# Patient Record
Sex: Male | Born: 1995 | Race: Black or African American | Hispanic: No | Marital: Married | Smoking: Former smoker
Health system: Southern US, Community
[De-identification: ages and names within clinical notes are randomized; demographics above are authoritative.]

---
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
## 2017-01-09 ENCOUNTER — Encounter: Payer: Self-pay | Admitting: Urgent Care

## 2017-01-09 ENCOUNTER — Ambulatory Visit (INDEPENDENT_AMBULATORY_CARE_PROVIDER_SITE_OTHER): Payer: 59 | Admitting: Urgent Care

## 2017-01-09 VITALS — BP 118/58 | HR 67 | Temp 99.3°F | Resp 18 | Ht 70.63 in | Wt 200.0 lb

## 2017-01-09 DIAGNOSIS — Z113 Encounter for screening for infections with a predominantly sexual mode of transmission: Secondary | ICD-10-CM | POA: Diagnosis not present

## 2017-01-09 DIAGNOSIS — Z202 Contact with and (suspected) exposure to infections with a predominantly sexual mode of transmission: Secondary | ICD-10-CM

## 2017-01-09 MED ORDER — AZITHROMYCIN 500 MG PO TABS: 1000.0000 mg | ORAL_TABLET | Freq: Once | ORAL | 0 refills | Status: AC

## 2017-01-09 MED ORDER — CEFTRIAXONE SODIUM 250 MG IJ SOLR
250.0000 mg | Freq: Once | INTRAMUSCULAR | Status: AC
  Administered 2017-01-09: 250 mg via INTRAMUSCULAR

## 2017-01-09 NOTE — Progress Notes (Signed)
    MRN: [id number] DOB: [birth year]  Subjective:   Timothy Andrews is a 21 y.o. male presenting for chief complaint of Exposure to STD (X 1 mth- chlamydia)  Reports having had sex with a male partner that tested positive for chlamydia. Denies dysuria, hematuria, urinary frequency, penile discharge, penile swelling, testicular pain, testicular swelling, anal pain, groin pain. Does not always use condoms for protection.  Timothy Andrews is not currently taking any medications and also has No Known Allergies.  Timothy Andrews denies past medical and surgical history.   Objective:   Vitals: BP (!) 118/58 (BP Location: Right Arm, Patient Position: Sitting, Cuff Size: Large)   Pulse 67   Temp 99.3 F (37.4 C) (Oral)   Resp 18   Ht 5' 10.63" (1.794 m)   Wt 200 lb (90.7 kg)   SpO2 99%   BMI 28.19 kg/m   Physical Exam  Constitutional: He is oriented to person, place, and time. He appears well-developed and well-nourished.  Cardiovascular: Normal rate.   Pulmonary/Chest: Effort normal.  Neurological: He is alert and oriented to person, place, and time.   Assessment and Plan :   1. Exposure to chlamydia 2. Routine screening for STI (sexually transmitted infection) - Will treat for chlamydia and gonorrhea. Labs pending. Counseled patient on safe sex. - HIV antibody - RPR - GC/Chlamydia Probe Amp - Trichomonas vaginalis, RNA   Wallis Bamberg, PA-C [facility] at [facility] [phone] 01/09/2017  5:01 PM

## 2017-01-09 NOTE — Patient Instructions (Addendum)
Safe Sex Practicing safe sex means taking steps before and during sex to reduce your risk of:  Getting an STD (sexually transmitted disease).  Giving your partner an STD.  Unwanted pregnancy.  How can I practice safe sex?  To practice safe sex:  Limit your sexual partners to only one partner who is having sex with only you.  Avoid using alcohol and recreational drugs before having sex. These substances can affect your judgment.  Before having sex with a new partner: ? Talk to your partner about past partners, past STDs, and drug use. ? You and your partner should be screened for STDs and discuss the results with each other.  Check your body regularly for sores, blisters, rashes, or unusual discharge. If you notice any of these problems, visit your health care provider.  If you have symptoms of an infection or you are being treated for an STD, avoid sexual contact.  While having sex, use a condom. Make sure to: ? Use a condom every time you have vaginal, oral, or anal sex. Both females and males should wear condoms during oral sex. ? Keep condoms in place from the beginning to the end of sexual activity. ? Use a latex condom, if possible. Latex condoms offer the best protection. ? Use only water-based lubricants or oils to lubricate a condom. Using petroleum-based lubricants or oils will weaken the condom and increase the chance that it will break.  See your health care provider for regular screenings, exams, and tests for STDs.  Talk with your health care provider about the form of birth control (contraception) that is best for you.  Get vaccinated against hepatitis B and human papillomavirus (HPV).  If you are at risk of being infected with HIV (human immunodeficiency virus), talk with your health care provider about taking a prescription medicine to prevent HIV infection. You are considered at risk for HIV if: ? You are a man who has sex with other men. ? You are a  heterosexual man or woman who is sexually active with more than one partner. ? You take drugs by injection. ? You are sexually active with a partner who has HIV.  This information is not intended to replace advice given to you by your health care provider. Make sure you discuss any questions you have with your health care provider. Document Released: 05/09/2004 Document Revised: 08/16/2015 Document Reviewed: 02/19/2015 Elsevier Interactive Patient Education  2018 [employer]     IF you received an x-ray today, you will receive an invoice from [facility]. Please contact [facility] at [phone] with questions or concerns regarding your invoice.   IF you received labwork today, you will receive an invoice from [facility]. Please contact [facility] at [phone] with questions or concerns regarding your invoice.   Our billing staff will not be able to assist you with questions regarding bills from these companies.  You will be contacted with the lab results as soon as they are available. The fastest way to get your results is to activate your My Chart account. Instructions are located on the last page of this paperwork. If you have not heard from us regarding the results in 2 weeks, please contact this office.      

## 2017-01-10 LAB — RPR: RPR: NONREACTIVE

## 2017-01-10 LAB — GC/CHLAMYDIA PROBE AMP
CHLAMYDIA, DNA PROBE: POSITIVE — AB
NEISSERIA GONORRHOEAE BY PCR: NEGATIVE

## 2017-01-10 LAB — HIV ANTIBODY (ROUTINE TESTING W REFLEX): HIV Screen 4th Generation wRfx: NONREACTIVE

## 2017-01-11 LAB — TRICHOMONAS VAGINALIS, PROBE AMP: TRICH VAG BY NAA: NEGATIVE

## 2017-01-13 ENCOUNTER — Telehealth: Payer: Self-pay | Admitting: Family Medicine

## 2017-01-14 NOTE — Telephone Encounter (Signed)
Patient is coming in on Friday.  

## 2017-01-17 ENCOUNTER — Ambulatory Visit (INDEPENDENT_AMBULATORY_CARE_PROVIDER_SITE_OTHER): Payer: 59 | Admitting: Urgent Care

## 2017-01-17 ENCOUNTER — Encounter: Payer: Self-pay | Admitting: Urgent Care

## 2017-01-17 VITALS — BP 140/80 | HR 54 | Temp 98.5°F | Resp 18 | Ht 70.0 in | Wt 206.5 lb

## 2017-01-17 DIAGNOSIS — Z113 Encounter for screening for infections with a predominantly sexual mode of transmission: Secondary | ICD-10-CM | POA: Diagnosis not present

## 2017-01-17 DIAGNOSIS — Z202 Contact with and (suspected) exposure to infections with a predominantly sexual mode of transmission: Secondary | ICD-10-CM | POA: Diagnosis not present

## 2017-01-17 MED ORDER — AZITHROMYCIN 500 MG PO TABS: 1000.0000 mg | ORAL_TABLET | Freq: Once | ORAL | 0 refills | Status: AC
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
## 2017-01-17 NOTE — Patient Instructions (Addendum)
Chlamydia, Male Chlamydia is an STD (sexually transmitted disease). It is a bacterial infection that spreads through sexual contact (is contagious). Chlamydia can occur in different areas of the body, including the tube that moves urine from the bladder out of the body (urethra), the throat, or the rectum. This condition is not difficult to treat. However, if left untreated, chlamydia can lead to more serious health problems. What are the causes? Chlamydia is caused by the bacteria Chlamydia trachomatis. It is passed from an infected partner during sexual activity. Chlamydia can spread through contact with the genitals, mouth, or rectum. What are the signs or symptoms? In some cases, there may not be any symptoms for this condition (asymptomatic), especially early in the infection. If symptoms develop, they may include:  Burning when urinating.  Urinating frequently.  Pain or swelling in the testicles.  Watery, mucus-like discharge from the penis.  Redness, soreness, and swelling (inflammation) of the rectum.  Bleeding or discharge from the rectum.  Abdominal pain.  Itching, burning, or redness in the eyes, or discharge from the eyes.  How is this diagnosed? This condition may be diagnosed based on:  Urine tests.  Swab tests. Depending on your symptoms, your health care provider may use a cotton swab to collect discharge from your urethra or rectum to test for the bacteria.  How is this treated? This condition is treated with antibiotic medicines. Follow these instructions at home: Medicines  Take over-the-counter and prescription medicines only as told by your health care provider.  Take your antibiotic medicine as told by your health care provider. Do not stop taking the antibiotic even if you start to feel better. Sexual activity  Tell sexual partners about your infection. This includes any oral, anal, or vaginal sex partners you have had within 60 days of when your  symptoms started. Sexual partners should also be treated, even if they have no signs of the disease.  Do not have sex until you and your sexual partners have completed treatment and your health care provider says it is okay. If your health care provider prescribed you a single dose treatment, wait 7 days after taking the treatment before having sex. General instructions  It is your responsibility to get your test results. Ask your health care provider, or the department performing the test, when your results will be ready.  Get plenty of rest.  Eat a healthy, well-balanced diet.  Drink enough fluids to keep your urine clear or pale yellow.  Keep all follow-up visits as told by your health care provider. This is important. You may need to be tested for infection again 3 months after treatment. How is this prevented? The only sure way to prevent chlamydia is to avoid sexual intercourse. However, you can lower your risk by:  Using latex condoms correctly every time you have sexual intercourse.  Not having multiple sexual partners.  Asking if your sexual partner has been tested for STIs and had negative results.  Contact a health care provider if:  You develop new symptoms or your symptoms do not get better after completing treatment.  You have a fever or chills.  You have pain during sexual intercourse.  You develop new joint pain or swelling near your joints.  You have pain or soreness in your testicles. Get help right away if:  Your pain gets worse and does not get better with medicine.  You have abnormal discharge.  You develop flu-like symptoms, such as night sweats, sore throat, or muscle   aches. Summary  Chlamydia is an STD (sexually transmitted disease). It is a bacterial infection that spreads (is contagious) through sexual contact.  This condition is not difficult to treat, however, if left untreated, it can lead to more serious health problems.  In some cases,  there may not be any symptoms for this condition (asymptomatic).  This condition is treated with antibiotic medicines.  Using latex condoms correctly every time you have sexual intercourse can help prevent chlamydia. This information is not intended to replace advice given to you by your health care provider. Make sure you discuss any questions you have with your health care provider. Document Released: 04/01/2005 Document Revised: 03/18/2016 Document Reviewed: 03/18/2016 Elsevier Interactive Patient Education  2017 [employer].     IF you received an x-ray today, you will receive an invoice from [facility]. Please contact [facility] at [phone] with questions or concerns regarding your invoice.   IF you received labwork today, you will receive an invoice from [place]. Please contact [facility] at [phone] with questions or concerns regarding your invoice.   Our billing staff will not be able to assist you with questions regarding bills from these companies.  You will be contacted with the lab results as soon as they are available. The fastest way to get your results is to activate your My Chart account. Instructions are located on the last page of this paperwork. If you have not heard from [place] regarding the results in 2 weeks, please contact this office.

## 2017-06-18 DIAGNOSIS — S83522A Sprain of posterior cruciate ligament of left knee, initial encounter: Secondary | ICD-10-CM | POA: Insufficient documentation

## 2017-07-13 ENCOUNTER — Encounter (HOSPITAL_COMMUNITY): Payer: Self-pay | Admitting: Emergency Medicine

## 2017-07-13 ENCOUNTER — Emergency Department (HOSPITAL_COMMUNITY)
Admission: EM | Admit: 2017-07-13 | Discharge: 2017-07-14 | Disposition: A | Discharge status: Home / Self Care | Payer: 59 | Attending: Emergency Medicine | Admitting: Emergency Medicine

## 2017-07-13 ENCOUNTER — Emergency Department (HOSPITAL_COMMUNITY): Payer: 59

## 2017-07-13 DIAGNOSIS — T5891XA Toxic effect of carbon monoxide from unspecified source, accidental (unintentional), initial encounter: Secondary | ICD-10-CM | POA: Insufficient documentation

## 2017-07-13 DIAGNOSIS — T59811A Toxic effect of smoke, accidental (unintentional), initial encounter: Secondary | ICD-10-CM

## 2017-07-13 DIAGNOSIS — J705 Respiratory conditions due to smoke inhalation: Secondary | ICD-10-CM | POA: Insufficient documentation

## 2017-07-13 DIAGNOSIS — J9601 Acute respiratory failure with hypoxia: Secondary | ICD-10-CM | POA: Diagnosis not present

## 2017-07-13 LAB — I-STAT VENOUS BLOOD GAS, ED
Acid-base deficit: 9 mmol/L — ABNORMAL HIGH (ref 0.0–2.0)
BICARBONATE: 19.4 mmol/L — AB (ref 20.0–28.0)
O2 Saturation: 27 %
PCO2 VEN: 50.3 mmHg (ref 44.0–60.0)
PO2 VEN: 22 mmHg — AB (ref 32.0–45.0)
TCO2: 21 mmol/L — AB (ref 22–32)
pH, Ven: 7.195 — CL (ref 7.250–7.430)

## 2017-07-13 LAB — BPAM FFP
BLOOD PRODUCT EXPIRATION DATE: 201904142359
Blood Product Expiration Date: 201904232359
ISSUE DATE / TIME: 201903312043
ISSUE DATE / TIME: 201903312043
UNIT TYPE AND RH: 6200
Unit Type and Rh: 6200

## 2017-07-13 LAB — CBC
HCT: 45.6 % (ref 39.0–52.0)
Hemoglobin: 15.4 g/dL (ref 13.0–17.0)
MCH: 29.7 pg (ref 26.0–34.0)
MCHC: 33.8 g/dL (ref 30.0–36.0)
MCV: 87.9 fL (ref 78.0–100.0)
PLATELETS: DECREASED 10*3/uL (ref 150–400)
RBC: 5.19 MIL/uL (ref 4.22–5.81)
RDW: 14.8 % (ref 11.5–15.5)
WBC: 9.8 10*3/uL (ref 4.0–10.5)

## 2017-07-13 LAB — COMPREHENSIVE METABOLIC PANEL
ALT: 14 U/L — AB (ref 17–63)
AST: 28 U/L (ref 15–41)
Albumin: 4.3 g/dL (ref 3.5–5.0)
Alkaline Phosphatase: 59 U/L (ref 38–126)
Anion gap: 16 — ABNORMAL HIGH (ref 5–15)
BUN: 5 mg/dL — ABNORMAL LOW (ref 6–20)
CHLORIDE: 105 mmol/L (ref 101–111)
CO2: 21 mmol/L — AB (ref 22–32)
CREATININE: 1.48 mg/dL — AB (ref 0.61–1.24)
Calcium: 9.1 mg/dL (ref 8.9–10.3)
GFR calc Af Amer: >60 mL/min (ref >60)
GFR calc non Af Amer: >60 mL/min (ref >60)
Glucose, Bld: 197 mg/dL — ABNORMAL HIGH (ref 65–99)
POTASSIUM: 3.7 mmol/L (ref 3.5–5.1)
SODIUM: 142 mmol/L (ref 135–145)
Total Bilirubin: 0.7 mg/dL (ref 0.3–1.2)
Total Protein: 7.2 g/dL (ref 6.5–8.1)

## 2017-07-13 LAB — PREPARE FRESH FROZEN PLASMA
UNIT DIVISION: 0
Unit division: 0

## 2017-07-13 LAB — RAPID URINE DRUG SCREEN, HOSP PERFORMED
AMPHETAMINES: NOT DETECTED
Barbiturates: NOT DETECTED
Benzodiazepines: POSITIVE — AB
Cocaine: NOT DETECTED
Opiates: NOT DETECTED
TETRAHYDROCANNABINOL: POSITIVE — AB

## 2017-07-13 LAB — I-STAT ARTERIAL BLOOD GAS, ED
Acid-base deficit: 5 mmol/L — ABNORMAL HIGH (ref 0.0–2.0)
BICARBONATE: 22.6 mmol/L (ref 20.0–28.0)
O2 Saturation: 99 %
PCO2 ART: 51.9 mmHg — AB (ref 32.0–48.0)
PO2 ART: 177 mmHg — AB (ref 83.0–108.0)
Patient temperature: 98.6
TCO2: 24 mmol/L (ref 22–32)
pH, Arterial: 7.247 — ABNORMAL LOW (ref 7.350–7.450)

## 2017-07-13 LAB — I-STAT CG4 LACTIC ACID, ED: Lactic Acid, Venous: 9.09 mmol/L (ref 0.5–1.9)

## 2017-07-13 LAB — COOXEMETRY PANEL
CARBOXYHEMOGLOBIN: 36.9 % — AB (ref 0.5–1.5)
Methemoglobin: 1.8 % — ABNORMAL HIGH (ref 0.0–1.5)
O2 SAT: 70.9 %
Total hemoglobin: 14.7 g/dL (ref 12.0–16.0)

## 2017-07-13 LAB — CK: Total CK: 192 U/L (ref 49–397)

## 2017-07-13 LAB — CBG MONITORING, ED: Glucose-Capillary: 212 mg/dL — ABNORMAL HIGH (ref 65–99)

## 2017-07-13 MED ORDER — SODIUM CHLORIDE 0.9 % IV BOLUS
2000.0000 mL | Freq: Once | INTRAVENOUS | Status: AC
  Administered 2017-07-13: 1000 mL via INTRAVENOUS

## 2017-07-13 MED ORDER — FENTANYL CITRATE (PF) 100 MCG/2ML IJ SOLN: 100.0000 ug | Freq: Once | INTRAMUSCULAR | Status: DC

## 2017-07-13 MED ORDER — PROPOFOL 1000 MG/100ML IV EMUL
INTRAVENOUS | Status: AC
  Filled 2017-07-13: qty 100

## 2017-07-13 MED ORDER — FENTANYL BOLUS VIA INFUSION
50.0000 ug | INTRAVENOUS | Status: DC | PRN
  Administered 2017-07-13 (×2): 50 ug via INTRAVENOUS
  Filled 2017-07-13: qty 50

## 2017-07-13 MED ORDER — FENTANYL 2500MCG IN NS 250ML (10MCG/ML) PREMIX INFUSION
25.0000 ug/h | INTRAVENOUS | Status: DC
Start: 2017-07-13 — End: 2017-07-14
  Administered 2017-07-13: 75 ug/h via INTRAVENOUS
  Filled 2017-07-13: qty 250

## 2017-07-13 MED ORDER — SODIUM NITRITE-SOD THIOSULFATE 300MG/10ML-12.5 GM/50ML IV KIT
1.0000 | PACK | Freq: Once | INTRAVENOUS | Status: AC
  Administered 2017-07-13: 1 via INTRAVENOUS
  Filled 2017-07-13: qty 1

## 2017-07-13 MED ORDER — ROCURONIUM BROMIDE 50 MG/5ML IV SOLN
100.0000 mg | Freq: Once | INTRAVENOUS | Status: AC
  Administered 2017-07-13: 100 mg via INTRAVENOUS
  Filled 2017-07-13: qty 10

## 2017-07-13 MED ORDER — ETOMIDATE 2 MG/ML IV SOLN
30.0000 mg | Freq: Once | INTRAVENOUS | Status: AC
  Administered 2017-07-13: 130 mg via INTRAVENOUS

## 2017-07-13 MED ORDER — FENTANYL CITRATE (PF) 100 MCG/2ML IJ SOLN
50.0000 ug | Freq: Once | INTRAMUSCULAR | Status: AC
  Administered 2017-07-13: 100 ug via INTRAVENOUS

## 2017-07-13 MED ORDER — LACTATED RINGERS IV SOLN
INTRAVENOUS | Status: DC
  Administered 2017-07-13: 22:00:00 via INTRAVENOUS

## 2017-07-13 MED ORDER — FENTANYL CITRATE (PF) 100 MCG/2ML IJ SOLN
INTRAMUSCULAR | Status: AC
  Administered 2017-07-13: 100 ug via INTRAVENOUS
  Filled 2017-07-13: qty 2
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
## 2017-07-13 NOTE — Progress Notes (Signed)
Critical values called to RT Gwendlyn DeutscherWilliam Brown, RRT on 07/13/2017 @ 21:44 by Donato Schultzracey Sara Selvidge RRT.  COhb 36.9 Methb 1.8
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
## 2017-07-13 NOTE — ED Notes (Signed)
[facility] at bedside 
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
## 2017-07-14 ENCOUNTER — Encounter: Payer: Self-pay | Admitting: Urgent Care

## 2017-07-14 DIAGNOSIS — T1490XA Injury, unspecified, initial encounter: Secondary | ICD-10-CM | POA: Insufficient documentation

## 2017-07-14 LAB — TYPE AND SCREEN
UNIT DIVISION: 0
Unit division: 0

## 2017-07-14 LAB — BPAM RBC
BLOOD PRODUCT EXPIRATION DATE: 201904162359
Blood Product Expiration Date: 201904152359
ISSUE DATE / TIME: 201903312042
ISSUE DATE / TIME: 201903312042
UNIT TYPE AND RH: 9500
Unit Type and Rh: 9500

## 2017-07-17 LAB — BLOOD GAS, ARTERIAL
ACID-BASE DEFICIT: 6.3 mmol/L — AB (ref 0.0–2.0)
BICARBONATE: 20.1 mmol/L (ref 20.0–28.0)
O2 SAT: 70.9 %
PCO2 ART: 51.2 mmHg — AB (ref 32.0–48.0)
PO2 ART: 30.1 mmHg — AB (ref 83.0–108.0)
Patient temperature: 98.6
pH, Arterial: 7.219 — ABNORMAL LOW (ref 7.350–7.450)

## 2017-07-21 DIAGNOSIS — J384 Edema of larynx: Secondary | ICD-10-CM | POA: Insufficient documentation

## 2017-10-15 ENCOUNTER — Other Ambulatory Visit (INDEPENDENT_AMBULATORY_CARE_PROVIDER_SITE_OTHER): Payer: 59

## 2017-10-15 ENCOUNTER — Ambulatory Visit (INDEPENDENT_AMBULATORY_CARE_PROVIDER_SITE_OTHER)
Admission: RE | Admit: 2017-10-15 | Discharge: 2017-10-15 | Disposition: A | Discharge status: Home / Self Care | Payer: 59 | Source: Ambulatory Visit | Attending: Internal Medicine | Admitting: Internal Medicine

## 2017-10-15 ENCOUNTER — Encounter: Payer: Self-pay | Admitting: Internal Medicine

## 2017-10-15 ENCOUNTER — Ambulatory Visit (INDEPENDENT_AMBULATORY_CARE_PROVIDER_SITE_OTHER): Payer: 59 | Admitting: Internal Medicine

## 2017-10-15 VITALS — BP 118/70 | HR 77 | Ht 70.0 in | Wt 192.0 lb

## 2017-10-15 DIAGNOSIS — R918 Other nonspecific abnormal finding of lung field: Secondary | ICD-10-CM

## 2017-10-15 DIAGNOSIS — J4550 Severe persistent asthma, uncomplicated: Secondary | ICD-10-CM

## 2017-10-15 DIAGNOSIS — R0609 Other forms of dyspnea: Secondary | ICD-10-CM | POA: Diagnosis not present

## 2017-10-15 LAB — CBC WITH DIFFERENTIAL/PLATELET
BASOS PCT: 0.3 % (ref 0.0–3.0)
Basophils Absolute: 0 10*3/uL (ref 0.0–0.1)
EOS ABS: 0.1 10*3/uL (ref 0.0–0.7)
EOS PCT: 1.3 % (ref 0.0–5.0)
HEMATOCRIT: 46.3 % (ref 39.0–52.0)
Hemoglobin: 15.2 g/dL (ref 13.0–17.0)
LYMPHS PCT: 24 % (ref 12.0–46.0)
Lymphs Abs: 2.3 10*3/uL (ref 0.7–4.0)
MCHC: 32.7 g/dL (ref 30.0–36.0)
MCV: 83.2 fl (ref 78.0–100.0)
MONO ABS: 0.7 10*3/uL (ref 0.1–1.0)
Monocytes Relative: 7.3 % (ref 3.0–12.0)
NEUTROS ABS: 6.5 10*3/uL (ref 1.4–7.7)
Neutrophils Relative %: 67.1 % (ref 43.0–77.0)
PLATELETS: 200 10*3/uL (ref 150.0–400.0)
RBC: 5.56 Mil/uL (ref 4.22–5.81)
RDW: 16.5 % — AB (ref 11.5–15.5)
WBC: 9.7 10*3/uL (ref 4.0–10.5)

## 2017-10-15 LAB — SEDIMENTATION RATE: Sed Rate: 5 mm/hr (ref 0–15)

## 2017-10-15 MED ORDER — BUDESONIDE-FORMOTEROL FUMARATE 160-4.5 MCG/ACT IN AERO: 2.0000 | INHALATION_SPRAY | Freq: Two times a day (BID) | RESPIRATORY_TRACT | 11 refills | Status: DC

## 2017-10-15 MED ORDER — BUDESONIDE-FORMOTEROL FUMARATE 80-4.5 MCG/ACT IN AERO: 2.0000 | INHALATION_SPRAY | Freq: Two times a day (BID) | RESPIRATORY_TRACT | 0 refills | Status: DC

## 2017-10-15 MED ORDER — AMOXICILLIN-POT CLAVULANATE 875-125 MG PO TABS: 1.0000 | ORAL_TABLET | Freq: Two times a day (BID) | ORAL | 0 refills | Status: AC

## 2017-10-15 NOTE — Patient Instructions (Addendum)
Plan A = Automatic = symbicotrt 160 Take 2 puffs first thing in am and then another 2 puffs about 12 hours later.   Work on inhaler technique:  relax and gently blow all the way out then take a nice smooth deep breath back in, triggering the inhaler at same time you start breathing in.  Hold for up to 5 seconds if you can. Blow out thru nose. Rinse and gargle with water when done  Augmentin 875 mg take one pill twice daily  X 10 days - take at breakfast and supper with large glass of water.  It would help reduce the usual side effects (diarrhea and yeast infections) if you ate cultured yogurt at lunch.   Stop pulmocort (budesonide)  Plan B = Backup Only use your albuterol as a rescue medication to be used if you can't catch your breath by resting   - The less you use it, the better it will work when you need it. - Ok to use  Albuterol up to   every 4 hours if you must but call for appointment if use goes up over your usual need   For cough > mucinex dm up to 1200 mg every 12 hours as needed  Please remember to go to the lab and x-ray department downstairs in the basement  for your tests - we will call you with the results when they are available.     Please schedule a follow up office visit in 2 weeks, sooner if needed
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
## 2017-10-16 ENCOUNTER — Encounter: Payer: Self-pay | Admitting: Internal Medicine

## 2017-10-16 DIAGNOSIS — J4550 Severe persistent asthma, uncomplicated: Secondary | ICD-10-CM | POA: Insufficient documentation
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
## 2017-10-20 NOTE — Progress Notes (Signed)
ATC, NA and no option to leave msg 

## 2017-10-21 NOTE — Progress Notes (Signed)
Spoke with pt and notified of results per Dr. [name]. Pt verbalized understanding and denied any questions. 

## 2017-10-23 DIAGNOSIS — H6123 Impacted cerumen, bilateral: Secondary | ICD-10-CM | POA: Insufficient documentation
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
## 2017-11-06 ENCOUNTER — Ambulatory Visit (INDEPENDENT_AMBULATORY_CARE_PROVIDER_SITE_OTHER): Payer: 59 | Admitting: Pulmonary Disease

## 2017-11-06 ENCOUNTER — Ambulatory Visit
Admission: RE | Admit: 2017-11-06 | Discharge: 2017-11-06 | Disposition: A | Discharge status: Home / Self Care | Payer: Self-pay | Source: Ambulatory Visit | Attending: Pulmonary Disease | Admitting: Pulmonary Disease

## 2017-11-06 ENCOUNTER — Encounter: Payer: Self-pay | Admitting: Pulmonary Disease

## 2017-11-06 ENCOUNTER — Ambulatory Visit (INDEPENDENT_AMBULATORY_CARE_PROVIDER_SITE_OTHER)
Admission: RE | Admit: 2017-11-06 | Discharge: 2017-11-06 | Disposition: A | Discharge status: Home / Self Care | Payer: 59 | Source: Ambulatory Visit | Attending: Pulmonary Disease | Admitting: Pulmonary Disease

## 2017-11-06 VITALS — BP 132/84 | HR 69 | Ht 70.0 in | Wt 195.2 lb

## 2017-11-06 DIAGNOSIS — R918 Other nonspecific abnormal finding of lung field: Secondary | ICD-10-CM

## 2017-11-06 DIAGNOSIS — R0609 Other forms of dyspnea: Secondary | ICD-10-CM

## 2017-11-06 DIAGNOSIS — J4550 Severe persistent asthma, uncomplicated: Secondary | ICD-10-CM | POA: Diagnosis not present

## 2017-11-06 MED ORDER — BUDESONIDE-FORMOTEROL FUMARATE 160-4.5 MCG/ACT IN AERO: 2.0000 | INHALATION_SPRAY | Freq: Two times a day (BID) | RESPIRATORY_TRACT | 6 refills | Status: DC

## 2017-11-06 MED ORDER — PREDNISONE 10 MG PO TABS: ORAL_TABLET | ORAL | 0 refills | Status: DC

## 2017-11-06 NOTE — Assessment & Plan Note (Signed)
High-res CT to be completed over the next 1 to 2 weeks >>> proceed to outpatient care coordinators to be scheduled for this  Spirometry today in office  Prednisone 10mg  tablet  >>>4 tabs for 2 days, then 3 tabs for 2 days, 2 tabs for 2 days, then 1 tab for 2 days, then stop >>> Take with food  Continue Symbicort 160 >>> 2 puffs in the morning right when you wake up, rinse out your mouth after use, 12 hours later 2 puffs, rinse after use >>> Take this daily, no matter what >>> This is not a rescue inhaler   Follow-up with [employer] regarding FMLA forms  Follow-up with our office in 2 weeks after completion of high-res CT

## 2017-11-06 NOTE — Progress Notes (Signed)
Discussed results with patient in office.  Nothing further is needed at this time.  Liborio Mack FNP  

## 2017-11-06 NOTE — Assessment & Plan Note (Signed)
High-res CT 2-week follow-up after completing high-res CT

## 2017-11-06 NOTE — Progress Notes (Signed)
Chart and office note reviewed in detail  > agree with a/p as outlined    

## 2017-11-06 NOTE — Assessment & Plan Note (Signed)
High-res CT Spirometry today in office Prednisone taper today Continue Symbicort Continue rescue inhaler 2-week follow-up

## 2017-11-06 NOTE — Patient Instructions (Addendum)
High-res CT to be completed over the next 1 to 2 weeks >>> proceed to outpatient care coordinators to be scheduled for this  Spirometry today in office  Prednisone 10mg  tablet  >>>4 tabs for 2 days, then 3 tabs for 2 days, 2 tabs for 2 days, then 1 tab for 2 days, then stop >>> Take with food  Continue Symbicort 160 >>> 2 puffs in the morning right when you wake up, rinse out your mouth after use, 12 hours later 2 puffs, rinse after use >>> Take this daily, no matter what >>> This is not a rescue inhaler    Follow-up with [employer] regarding FMLA forms  Follow-up with our office in 2 weeks after completion of high-res CT   Please contact the office if your symptoms worsen or you have concerns that you are not improving.   Thank you for choosing [facility] for your healthcare, and for allowing us to partner with you on your healthcare journey. I am thankful to be able to provide care to you today.   Elisha HeadlandBrian Mack FNP-C
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
## 2017-11-19 ENCOUNTER — Encounter: Payer: Self-pay | Admitting: Pulmonary Disease

## 2017-11-19 ENCOUNTER — Ambulatory Visit (INDEPENDENT_AMBULATORY_CARE_PROVIDER_SITE_OTHER): Payer: 59 | Admitting: Pulmonary Disease

## 2017-11-19 VITALS — BP 128/82 | HR 61 | Ht 70.0 in | Wt 197.4 lb

## 2017-11-19 DIAGNOSIS — R918 Other nonspecific abnormal finding of lung field: Secondary | ICD-10-CM | POA: Diagnosis not present

## 2017-11-19 DIAGNOSIS — R0609 Other forms of dyspnea: Secondary | ICD-10-CM | POA: Diagnosis not present

## 2017-11-19 MED ORDER — LEVALBUTEROL HCL 0.63 MG/3ML IN NEBU
0.6300 mg | INHALATION_SOLUTION | RESPIRATORY_TRACT | Status: AC
  Administered 2017-11-19: 0.63 mg via RESPIRATORY_TRACT

## 2017-11-19 NOTE — Patient Instructions (Addendum)
Pulmonary Function Testing today  >>>to further evaluate your lungs   Echocardiogram ordered >>>to further evaluate shortness of breath and get a good picture of your heart    Overnight oximetry test ordered  >>>this will be completed by the home health company to observe your oxygen levels at night    Xopenex breathing treatment in office  >>>to check your responsiveness to breathing treatments   Bronchiectasis: This is the medical term which indicates that you have damage, dilated airways making you more susceptible to respiratory infection. Use a flutter valve 10 breaths twice a day or 4 to 5 breaths 4-5 times a day to help clear mucus out Let us know if you have cough with change in mucus color or fevers or chills.  At that point you would need an antibiotic. Maintain a healthy nutritious diet, eating whole foods Take your medications as prescribed   .  Follow up in 1-2 weeks with our office      Please contact the office if your symptoms worsen or you have concerns that you are not improving.   Thank you for choosing [facility] for your healthcare, and for allowing us to partner with you on your healthcare journey. I am thankful to be able to provide care to you today.   Elisha HeadlandBrian Taylormarie Register FNP-C

## 2017-11-19 NOTE — Assessment & Plan Note (Signed)
Reviewed high-res CT with patient Flutter valve provided today Pulmonary function test today

## 2017-11-19 NOTE — Progress Notes (Signed)
Chart and office note reviewed in detail  > agree with a/p as outlined    

## 2017-11-19 NOTE — Assessment & Plan Note (Signed)
Pulmonary Function Testing today  >>>to further evaluate your lungs   Echocardiogram ordered >>>to further evaluate shortness of breath and get a good picture of your heart    Overnight oximetry test ordered  >>>this will be completed by the home health company to observe your oxygen levels at night    Xopenex breathing treatment in office  >>>to check your responsiveness to breathing treatments   Bronchiectasis: This is the medical term which indicates that you have damage, dilated airways making you more susceptible to respiratory infection. Use a flutter valve 10 breaths twice a day or 4 to 5 breaths 4-5 times a day to help clear mucus out Let us know if you have cough with change in mucus color or fevers or chills.  At that point you would need an antibiotic. Maintain a healthy nutritious diet, eating whole foods Take your medications as prescribed   Follow up with our office in 1-2 weeks

## 2017-11-20 ENCOUNTER — Ambulatory Visit (INDEPENDENT_AMBULATORY_CARE_PROVIDER_SITE_OTHER): Payer: 59 | Admitting: Internal Medicine

## 2017-11-20 DIAGNOSIS — R0609 Other forms of dyspnea: Secondary | ICD-10-CM | POA: Diagnosis not present

## 2017-11-20 LAB — PULMONARY FUNCTION TEST
DL/VA % pred: 124 %
DL/VA: 5.83 ml/min/mmHg/L
DLCO COR % PRED: 73 %
DLCO COR: 23.61 ml/min/mmHg
DLCO unc % pred: 74 %
DLCO unc: 24 ml/min/mmHg
FEF 25-75 Post: 0.4 L/sec
FEF 25-75 Pre: 0.37 L/sec
FEF2575-%CHANGE-POST: 8 %
FEF2575-%PRED-PRE: 8 %
FEF2575-%Pred-Post: 8 %
FEV1-%Change-Post: 5 %
FEV1-%Pred-Post: 24 %
FEV1-%Pred-Pre: 23 %
FEV1-Post: 0.97 L
FEV1-Pre: 0.92 L
FEV1FVC-%CHANGE-POST: -4 %
FEV1FVC-%PRED-PRE: 50 %
FEV6-%Change-Post: 4 %
FEV6-%PRED-PRE: 45 %
FEV6-%Pred-Post: 47 %
FEV6-Post: 2.2 L
FEV6-Pre: 2.12 L
FEV6FVC-%Change-Post: -5 %
FEV6FVC-%Pred-Post: 94 %
FEV6FVC-%Pred-Pre: 99 %
FVC-%Change-Post: 10 %
FVC-%PRED-POST: 50 %
FVC-%PRED-PRE: 45 %
FVC-POST: 2.37 L
FVC-PRE: 2.15 L
PRE FEV1/FVC RATIO: 43 %
Post FEV1/FVC ratio: 41 %
Post FEV6/FVC ratio: 93 %
Pre FEV6/FVC Ratio: 99 %
RV % pred: 319 %
RV: 4.76 L
TLC % pred: 115 %
TLC: 7.96 L

## 2017-11-20 MED ORDER — FLUTTER DEVI: 0 refills | Status: AC

## 2017-11-20 NOTE — Progress Notes (Signed)
PFT completed today.  

## 2017-11-20 NOTE — Addendum Note (Signed)
Addended by: Kerin RansomBLACKWELL, Shantasia Hunnell on: 11/20/2017 09:13 AM   Modules accepted: Orders

## 2017-11-21 ENCOUNTER — Other Ambulatory Visit: Payer: Self-pay | Admitting: Pulmonary Disease

## 2017-11-21 ENCOUNTER — Telehealth: Payer: Self-pay | Admitting: Pulmonary Disease

## 2017-11-21 DIAGNOSIS — R0609 Other forms of dyspnea: Principal | ICD-10-CM

## 2017-11-21 DIAGNOSIS — R918 Other nonspecific abnormal finding of lung field: Secondary | ICD-10-CM

## 2017-11-21 MED ORDER — TIOTROPIUM BROMIDE MONOHYDRATE 2.5 MCG/ACT IN AERS: 2.0000 | INHALATION_SPRAY | Freq: Every day | RESPIRATORY_TRACT | 5 refills | Status: DC

## 2017-11-21 MED ORDER — PREDNISONE 10 MG PO TABS: ORAL_TABLET | ORAL | 0 refills | Status: DC

## 2017-11-21 NOTE — Telephone Encounter (Signed)
Spoke with pt in the lobby and showed him how to use Spiriva. I made him show me after demonstration. Pt understood and I advised him to call us if he had additional questions.

## 2017-11-21 NOTE — Progress Notes (Signed)
Pulmonary function test results of come back.  Showing persistently decreased lung functioning.  I think it is time that we can add Spiriva Respimat which is another inhaler therapy that you will use daily.  Spiriva Respimat 2.5 >>>2 puffs daily >>>samples available for pick up at our office   Also will give another course of prednisone.  Prednisone 10mg  tablet  >>>4 tabs for 3 days, then 3 tabs for 3 days, 2 tabs for 3 days, then 1 tab for 3 days, then stop >>>take with food in the morning    We can discuss further at your next appointment with our office.  Continue with echocardiogram as discussed.  Elisha HeadlandBrian Mack, FNP

## 2017-11-26 ENCOUNTER — Other Ambulatory Visit: Payer: Self-pay

## 2017-11-26 ENCOUNTER — Ambulatory Visit (HOSPITAL_COMMUNITY): Payer: 59 | Attending: Cardiology

## 2017-11-26 DIAGNOSIS — Z87891 Personal history of nicotine dependence: Secondary | ICD-10-CM | POA: Insufficient documentation

## 2017-11-26 DIAGNOSIS — R0609 Other forms of dyspnea: Secondary | ICD-10-CM | POA: Diagnosis present

## 2017-11-26 NOTE — Progress Notes (Signed)
Your echocardiogram shows normal systolic function.  This is good news.  Also normal pulmonary arteries.  This is also good news.  Continue with plan of care  Elisha HeadlandBrian Mack, FNP

## 2017-11-27 ENCOUNTER — Encounter: Payer: Self-pay | Admitting: Pulmonary Disease
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
## 2017-12-04 ENCOUNTER — Ambulatory Visit (INDEPENDENT_AMBULATORY_CARE_PROVIDER_SITE_OTHER): Payer: 59 | Admitting: Pulmonary Disease

## 2017-12-04 ENCOUNTER — Encounter: Payer: Self-pay | Admitting: Pulmonary Disease

## 2017-12-04 ENCOUNTER — Telehealth: Payer: Self-pay

## 2017-12-04 VITALS — BP 142/78 | HR 64 | Ht 70.0 in | Wt 196.2 lb

## 2017-12-04 DIAGNOSIS — R0609 Other forms of dyspnea: Secondary | ICD-10-CM

## 2017-12-04 DIAGNOSIS — J4550 Severe persistent asthma, uncomplicated: Secondary | ICD-10-CM

## 2017-12-04 DIAGNOSIS — G4734 Idiopathic sleep related nonobstructive alveolar hypoventilation: Secondary | ICD-10-CM | POA: Insufficient documentation

## 2017-12-04 DIAGNOSIS — R918 Other nonspecific abnormal finding of lung field: Secondary | ICD-10-CM

## 2017-12-04 MED ORDER — TIOTROPIUM BROMIDE MONOHYDRATE 2.5 MCG/ACT IN AERS: 2.0000 | INHALATION_SPRAY | Freq: Every day | RESPIRATORY_TRACT | 5 refills | Status: DC

## 2017-12-04 MED ORDER — ALBUTEROL SULFATE (2.5 MG/3ML) 0.083% IN NEBU: 2.5000 mg | INHALATION_SOLUTION | RESPIRATORY_TRACT | 12 refills | Status: DC | PRN

## 2017-12-04 MED ORDER — ALBUTEROL SULFATE (2.5 MG/3ML) 0.083% IN NEBU: 2.5000 mg | INHALATION_SOLUTION | RESPIRATORY_TRACT | 12 refills | Status: AC | PRN

## 2017-12-04 NOTE — Assessment & Plan Note (Addendum)
Reviewed PFTs and echocardiogram results with patient We will [facility] pulmonary [facility]up Overnight oximetry test reviewed today we will start patient on 2 L O2 at night

## 2017-12-04 NOTE — Assessment & Plan Note (Addendum)
[facility]  Use flutter valve 5 times a day 5 to 10 breaths each time  Continue Spiriva 2.5 >>>2 puffs daily  Continue Symbicort 160 >>> 2 puffs in the morning right when you wake up, rinse out your mouth after use, 12 hours later 2 puffs, rinse after use >>> Take this daily, no matter what >>> This is not a rescue inhaler   Can also use albuterol nebulizers that you have at home every 4-6 hours as needed for shortness of breath or wheezing  Follow-up with our office in 2 weeks >>>Okay to cancel this appointment if you have a [facility]

## 2017-12-04 NOTE — Addendum Note (Signed)
Addended by: Kerin RansomBLACKWELL, Maxwell Lemen on: 12/04/2017 01:42 PM   Modules accepted: Orders

## 2017-12-04 NOTE — Addendum Note (Signed)
Addended by: Kerin RansomBLACKWELL, Nussen Pullin on: 12/04/2017 04:18 PM   Modules accepted: Orders

## 2017-12-04 NOTE — Assessment & Plan Note (Signed)
Will place order for 2 L of oxygen started at night after reviewing patient's overnight oximetry study completed on 11/27/2017.

## 2017-12-04 NOTE — Telephone Encounter (Signed)
Called patient, made patient aware of ONO results, informed patient he will need oxygen at night. Order for 2L QHS placed with [facility]. No questions at this time. Informed patient that if any questions arised to call our office.

## 2017-12-04 NOTE — Patient Instructions (Signed)
[facility]  Use flutter valve 5 times a day 5 to 10 breaths each time  Continue Spiriva 2.5 >>>2 puffs daily  Continue Symbicort 160 >>> 2 puffs in the morning right when you wake up, rinse out your mouth after use, 12 hours later 2 puffs, rinse after use >>> Take this daily, no matter what >>> This is not a rescue inhaler   Can also use albuterol nebulizers that you have at home every 4-6 hours as needed for shortness of breath or wheezing  Follow-up with our office in 2 weeks >>>Okay to cancel this appointment if you [facility] a [facility]  We will follow-up with [facility] to receive your overnight oximetry test results >>> We will call you with those results   Please contact the office if your symptoms worsen or you have concerns that you are not improving.   Thank you for choosing [facility] for your healthcare, and for allowing us to partner with you on your healthcare journey. I am thankful to be able to provide care to you today.   Elisha HeadlandBrian Mack FNP-C

## 2017-12-08 NOTE — Progress Notes (Signed)
Chart and office note reviewed in detail  > agree with a/p as outlined    

## 2017-12-15 ENCOUNTER — Telehealth (HOSPITAL_COMMUNITY): Payer: Self-pay | Admitting: *Deleted

## 2017-12-15 NOTE — Telephone Encounter (Signed)
Pulmonary Rehab referral received from Dr. Allena Katz at [facility]. Clinical review of pt follow up appt on 8/27 at the ILD program at [facility]. Pt appropriate for scheduling for Pulmonary rehab when MD order form is completed and returned.  Will forward to support staff for scheduling and verification of insurance eligibility/benefits with pt  consent. Alanson Aly, BSN Cardiac and Emergency planning/management officer

## 2017-12-17 ENCOUNTER — Ambulatory Visit: Payer: 59 | Admitting: Pulmonary Disease

## 2017-12-19 ENCOUNTER — Telehealth (HOSPITAL_COMMUNITY): Payer: Self-pay

## 2017-12-19 NOTE — Telephone Encounter (Signed)
Pt insurance is active and benefits verified through [facility]. Co-pay $10.00, DED $0.00/$0.00 met, out of pocket $2,500.00/$2,500.00 met, co-insurance 0%. No pre-authorization. [name]/[employer], 12/19/17 @ 3:10PM, VOZ#[id number]

## 2017-12-23 ENCOUNTER — Telehealth (HOSPITAL_COMMUNITY): Payer: Self-pay

## 2017-12-23 NOTE — Telephone Encounter (Signed)
Called patient in regards to Pulmonary Rehab - Scheduled orientation on 01/07/18 at 9:30am. Patient will attend the 10:30am exc class. Mailed packet.

## 2018-01-07 ENCOUNTER — Encounter (HOSPITAL_COMMUNITY): Payer: Self-pay

## 2018-01-07 ENCOUNTER — Encounter (HOSPITAL_COMMUNITY)
Admission: RE | Admit: 2018-01-07 | Discharge: 2018-01-07 | Disposition: A | Discharge status: Home / Self Care | Payer: 59 | Source: Ambulatory Visit | Attending: Pulmonary Disease | Admitting: Pulmonary Disease

## 2018-01-07 VITALS — BP 134/80 | HR 72 | Temp 98.6°F | Resp 18 | Ht 70.0 in | Wt 195.8 lb

## 2018-01-07 DIAGNOSIS — Z87891 Personal history of nicotine dependence: Secondary | ICD-10-CM | POA: Diagnosis not present

## 2018-01-07 DIAGNOSIS — Z79899 Other long term (current) drug therapy: Secondary | ICD-10-CM | POA: Diagnosis not present

## 2018-01-07 DIAGNOSIS — J849 Interstitial pulmonary disease, unspecified: Secondary | ICD-10-CM

## 2018-01-07 DIAGNOSIS — Z7951 Long term (current) use of inhaled steroids: Secondary | ICD-10-CM | POA: Insufficient documentation
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
## 2018-01-08 ENCOUNTER — Encounter (HOSPITAL_COMMUNITY)
Admission: RE | Admit: 2018-01-08 | Discharge: 2018-01-08 | Disposition: A | Discharge status: Home / Self Care | Payer: 59 | Source: Ambulatory Visit | Attending: Pulmonary Disease | Admitting: Pulmonary Disease

## 2018-01-08 DIAGNOSIS — J849 Interstitial pulmonary disease, unspecified: Secondary | ICD-10-CM | POA: Diagnosis not present

## 2018-01-09 NOTE — Progress Notes (Signed)
Pulmonary Individual Treatment Plan  Patient Details  Name: Timothy Andrews MRN: [id number] Date of Birth: 20-Aug-1995 Referring Provider:     Pulmonary Rehab Walk Test from 01/08/2018 in [facility] CARDIAC [facility]  Referring Provider  Dr. Molli Knock      Initial Encounter Date:    Pulmonary Rehab Walk Test from 01/08/2018 in [facility] CARDIAC REHAB  Date  01/08/18      Visit Diagnosis: ILD (interstitial lung disease) (HCC)  Patient's Home Medications on Admission:   Current Outpatient Medications:  .  albuterol (PROVENTIL) (2.5 MG/3ML) 0.083% nebulizer solution, Take 3 mLs (2.5 mg total) by nebulization every 4 (four) hours as needed for wheezing or shortness of breath., Disp: 75 mL, Rfl: 12 .  budesonide-formoterol (SYMBICORT) 160-4.5 MCG/ACT inhaler, Inhale 2 puffs into the lungs 2 (two) times daily., Disp: 1 Inhaler, Rfl: 11 .  budesonide-formoterol (SYMBICORT) 160-4.5 MCG/ACT inhaler, Inhale 2 puffs into the lungs every 12 (twelve) hours., Disp: 1 Inhaler, Rfl: 6 .  Respiratory Therapy Supplies (FLUTTER) DEVI, Use as directed, Disp: 1 each, Rfl: 0 .  Tiotropium Bromide Monohydrate (SPIRIVA RESPIMAT) 2.5 MCG/ACT AERS, Inhale 2 puffs into the lungs daily., Disp: 1 Inhaler, Rfl: 5  Past Medical History: No past medical history on file.  Tobacco Use: Social History   Tobacco Use  Smoking Status Former Smoker  . Packs/day: 0.25  . Years: 3.00  . Pack years: 0.75  . Types: Cigars  . Last attempt to quit: 07/13/2017  . Years since quitting: 0.4  Smokeless Tobacco Never Used    Labs: Recent Review Advice worker    Labs for ITP Cardiac and Pulmonary Rehab Latest Ref Rng & Units 07/13/2017 07/13/2017 07/13/2017 07/13/2017   PHART 7.350 - 7.450 7.219(L) 7.247(L) - -   PCO2ART 32.0 - 48.0 mmHg 51.2(H) 51.9(H) - -   HCO3 20.0 - 28.0 mmol/L 20.1 22.6 19.4(L) -   TCO2 22 - 32 mmol/L - 24 21(L) -   ACIDBASEDEF 0.0 - 2.0 mmol/L 6.3(H) 5.0(H) 9.0(H) -     O2SAT % 70.9 99.0 27.0 70.9      Capillary Blood Glucose: Lab Results  Component Value Date   GLUCAP 212 (H) 07/13/2017     Pulmonary Assessment Scores: Pulmonary Assessment Scores    Row Name 01/09/18 1005         ADL UCSD   ADL Phase  Entry       mMRC Score   mMRC Score  2        Pulmonary Function Assessment:   Exercise Target Goals: Exercise Program Goal: Individual exercise prescription set using results from initial 6 min walk test and THRR while considering  patient's activity barriers and safety.   Exercise Prescription Goal: Initial exercise prescription builds to 30-45 minutes a day of aerobic activity, 2-3 days per week.  Home exercise guidelines will be given to patient during program as part of exercise prescription that the participant will acknowledge.  Activity Barriers & Risk Stratification: Activity Barriers & Cardiac Risk Stratification - 01/07/18 1036      Activity Barriers & Cardiac Risk Stratification   Activity Barriers  Shortness of Breath;Back Problems   l knee hyperextended February 10th      6 Minute Walk: 6 Minute Walk    Row Name 01/09/18 1004         6 Minute Walk   Phase  Initial     Distance  1275 feet     Walk Time  6 minutes     # of Rest Breaks  0     MPH  2.41     METS  2.84     RPE  12     Perceived Dyspnea   1     Symptoms  No     Resting HR  73 bpm     Resting BP  134/84     Resting Oxygen Saturation   96 %     Exercise Oxygen Saturation  during 6 min walk  88 %     Max Ex. HR  77 bpm     Max Ex. BP  140/80       Interval HR   1 Minute HR  76     2 Minute HR  77     3 Minute HR  77     4 Minute HR  75     5 Minute HR  75     6 Minute HR  75     2 Minute Post HR  72     Interval Heart Rate?  Yes       Interval Oxygen   Interval Oxygen?  Yes     Baseline Oxygen Saturation %  96 %     1 Minute Oxygen Saturation %  94 %     1 Minute Liters of Oxygen  0 L     2 Minute Oxygen Saturation %  92 %      2 Minute Liters of Oxygen  0 L     3 Minute Oxygen Saturation %  89 %     3 Minute Liters of Oxygen  0 L     4 Minute Oxygen Saturation %  88 %     4 Minute Liters of Oxygen  0 L     5 Minute Oxygen Saturation %  90 %     5 Minute Liters of Oxygen  0 L     6 Minute Oxygen Saturation %  88 %     6 Minute Liters of Oxygen  0 L     2 Minute Post Oxygen Saturation %  96 %     2 Minute Post Liters of Oxygen  0 L        Oxygen Initial Assessment: Oxygen Initial Assessment - 01/09/18 1005      Initial 6 min Walk   Oxygen Used  None      Program Oxygen Prescription   Program Oxygen Prescription  None       Oxygen Re-Evaluation:   Oxygen Discharge (Final Oxygen Re-Evaluation):   Initial Exercise Prescription: Initial Exercise Prescription - 01/09/18 1000      Date of Initial Exercise RX and Referring Provider   Date  01/08/18    Referring Provider  Dr. Molli Knock      Treadmill   MPH  2.5    Grade  3    Minutes  17      Bike   Level  0.8    Minutes  17      Rower   Level  2    Watts  40    Minutes  17      Prescription Details   Frequency (times per week)  2    Duration  Progress to 45 minutes of aerobic exercise without signs/symptoms of physical distress      Intensity   THRR 40-80% of Max Heartrate  79-158    Ratings of Perceived  Exertion  11-13    Perceived Dyspnea  0-4      Progression   Progression  Continue progressive overload as per policy without signs/symptoms or physical distress.      Resistance Training   Training Prescription  Yes    Weight  blue bands    Reps  10-15       Perform Capillary Blood Glucose checks as needed.  Exercise Prescription Changes:   Exercise Comments:   Exercise Goals and Review: Exercise Goals    Row Name 01/07/18 1018             Exercise Goals   Increase Physical Activity  Yes       Intervention  Provide advice, education, support and counseling about physical activity/exercise needs.;Develop an  individualized exercise prescription for aerobic and resistive training based on initial evaluation findings, risk stratification, comorbidities and participant's personal goals.       Expected Outcomes  Short Term: Attend rehab on a regular basis to increase amount of physical activity.;Long Term: Exercising regularly at least 3-5 days a week.;Long Term: Add in home exercise to make exercise part of routine and to increase amount of physical activity.       Increase Strength and Stamina  Yes       Intervention  Provide advice, education, support and counseling about physical activity/exercise needs.;Develop an individualized exercise prescription for aerobic and resistive training based on initial evaluation findings, risk stratification, comorbidities and participant's personal goals.       Expected Outcomes  Short Term: Increase workloads from initial exercise prescription for resistance, speed, and METs.;Short Term: Perform resistance training exercises routinely during rehab and add in resistance training at home;Long Term: Improve cardiorespiratory fitness, muscular endurance and strength as measured by increased METs and functional capacity ( )       Able to understand and use rate of perceived exertion (RPE) scale  Yes       Intervention  Provide education and explanation on how to use RPE scale       Expected Outcomes  Short Term: Able to use RPE daily in rehab to express subjective intensity level;Long Term:  Able to use RPE to guide intensity level when exercising independently       Able to understand and use Dyspnea scale  Yes       Intervention  Provide education and explanation on how to use Dyspnea scale       Expected Outcomes  Short Term: Able to use Dyspnea scale daily in rehab to express subjective sense of shortness of breath during exertion;Long Term: Able to use Dyspnea scale to guide intensity level when exercising independently       Knowledge and understanding of Target Heart  Rate Range (THRR)  Yes       Intervention  Provide education and explanation of THRR including how the numbers were predicted and where they are located for reference       Expected Outcomes  Short Term: Able to state/look up THRR;Long Term: Able to use THRR to govern intensity when exercising independently;Short Term: Able to use daily as guideline for intensity in rehab       Understanding of Exercise Prescription  Yes       Intervention  Provide education, explanation, and written materials on patient's individual exercise prescription       Expected Outcomes  Short Term: Able to explain program exercise prescription;Long Term: Able to explain home exercise prescription to exercise independently  Exercise Goals Re-Evaluation :   Discharge Exercise Prescription (Final Exercise Prescription Changes):   Nutrition:  Target Goals: Understanding of nutrition guidelines, daily intake of sodium 1500mg , cholesterol 200mg , calories 30% from fat and 7% or less from saturated fats, daily to have 5 or more servings of fruits and vegetables.  Biometrics:    Nutrition Therapy Plan and Nutrition Goals:   Nutrition Assessments:   Nutrition Goals Re-Evaluation:   Nutrition Goals Discharge (Final Nutrition Goals Re-Evaluation):   Psychosocial: Target Goals: Acknowledge presence or absence of significant depression and/or stress, maximize coping skills, provide positive support system. Participant is able to verbalize types and ability to use techniques and skills needed for reducing stress and depression.  Initial Review & Psychosocial Screening: Initial Psych Review & Screening - 01/07/18 1518      Initial Review   Comments  pt has had to move back home with his mom and this has caused some intra familial strain, pt shortness of breath keeps him from being able to do ADL and work.      Family Dynamics   Comments  family has been supportive during his recovery      Barriers    Psychosocial barriers to participate in program  The patient should benefit from training in stress management and relaxation.       Quality of Life Scores:  Scores of 19 and below usually indicate a poorer quality of life in these areas.  A difference of  2-3 points is a clinically meaningful difference.  A difference of 2-3 points in the total score of the Quality of Life Index has been associated with significant improvement in overall quality of life, self-image, physical symptoms, and general health in studies assessing change in quality of life.  PHQ-9: Recent Review Flowsheet Data    Depression screen [facility] 2/9 01/07/2018 01/07/2018 01/17/2017 01/09/2017   Decreased Interest - 0 0 0   Down, Depressed, Hopeless - 2 0 0   PHQ - 2 Score - 2 0 0   Altered sleeping - 0 - -   Tired, decreased energy - 0 - -   Change in appetite - 0 - -   Feeling bad or failure about yourself  - 3 - -   Trouble concentrating - 0 - -   Moving slowly or fidgety/restless - 0 - -   Suicidal thoughts (No Data)  1 - -   PHQ-9 Score - 6 - -   Difficult doing work/chores - Very difficult - -     Interpretation of Total Score  Total Score Depression Severity:  1-4 = Minimal depression, 5-9 = Mild depression, 10-14 = Moderate depression, 15-19 = Moderately severe depression, 20-27 = Severe depression   Psychosocial Evaluation and Intervention: Psychosocial Evaluation - 01/07/18 1034      Psychosocial Evaluation & Interventions   Interventions  Stress management education;Relaxation education;Encouraged to exercise with the program and follow exercise prescription    Continue Psychosocial Services   Follow up required by staff       Psychosocial Re-Evaluation:   Psychosocial Discharge (Final Psychosocial Re-Evaluation):   Education: Education Goals: Education classes will be provided on a weekly basis, covering required topics. Participant will state understanding/return demonstration of topics  presented.  Learning Barriers/Preferences: Learning Barriers/Preferences - 01/07/18 1035      Learning Barriers/Preferences   Learning Barriers  None    Learning Preferences  Skilled Demonstration;Group Instruction;Video;Written Material       Education Topics: Risk Factor Reduction:  -  Group instruction that is supported by a PowerPoint presentation. Instructor discusses the definition of a risk factor, different risk factors for pulmonary disease, and how the heart and lungs work together.     Nutrition for Pulmonary Patient:  -Group instruction provided by PowerPoint slides, verbal discussion, and written materials to support subject matter. The instructor gives an explanation and review of healthy diet recommendations, which includes a discussion on weight management, recommendations for fruit and vegetable consumption, as well as protein, fluid, caffeine, fiber, sodium, sugar, and alcohol. Tips for eating when patients are short of breath are discussed.   Pursed Lip Breathing:  -Group instruction that is supported by demonstration and informational handouts. Instructor discusses the benefits of pursed lip and diaphragmatic breathing and detailed demonstration on how to preform both.     Oxygen Safety:  -Group instruction provided by PowerPoint, verbal discussion, and written material to support subject matter. There is an overview of "What is Oxygen" and "Why do we need it".  Instructor also reviews how to create a safe environment for oxygen use, the importance of using oxygen as prescribed, and the risks of noncompliance. There is a brief discussion on traveling with oxygen and resources the patient may utilize.   Oxygen Equipment:  -Group instruction provided by [facility] Staff utilizing handouts, written materials, and equipment demonstrations.   Signs and Symptoms:  -Group instruction provided by written material and verbal discussion to support subject matter. Warning  signs and symptoms of infection, stroke, and heart attack are reviewed and when to call the physician/911 reinforced. Tips for preventing the spread of infection discussed.   Advanced Directives:  -Group instruction provided by verbal instruction and written material to support subject matter. Instructor reviews Advanced Directive laws and proper instruction for filling out document.   Pulmonary Video:  -Group video education that reviews the importance of medication and oxygen compliance, exercise, good nutrition, pulmonary hygiene, and pursed lip and diaphragmatic breathing for the pulmonary patient.   Exercise for the Pulmonary Patient:  -Group instruction that is supported by a PowerPoint presentation. Instructor discusses benefits of exercise, core components of exercise, frequency, duration, and intensity of an exercise routine, importance of utilizing pulse oximetry during exercise, safety while exercising, and options of places to exercise outside of rehab.     Pulmonary Medications:  -Verbally interactive group education provided by instructor with focus on inhaled medications and proper administration.   Anatomy and Physiology of the Respiratory System and Intimacy:  -Group instruction provided by PowerPoint, verbal discussion, and written material to support subject matter. Instructor reviews respiratory cycle and anatomical components of the respiratory system and their functions. Instructor also reviews differences in obstructive and restrictive respiratory diseases with examples of each. Intimacy, Sex, and Sexuality differences are reviewed with a discussion on how relationships can change when diagnosed with pulmonary disease. Common sexual concerns are reviewed.   MD DAY -A group question and answer session with a medical doctor that allows participants to ask questions that relate to their pulmonary disease state.   OTHER EDUCATION -Group or individual verbal, written, or  video instructions that support the educational goals of the pulmonary rehab program.   Holiday Eating Survival Tips:  -Group instruction provided by PowerPoint slides, verbal discussion, and written materials to support subject matter. The instructor gives patients tips, tricks, and techniques to help them not only survive but enjoy the holidays despite the onslaught of food that accompanies the holidays.   Knowledge Questionnaire Score:   Core Components/Risk  Factors/Patient Goals at Admission: Personal Goals and Risk Factors at Admission - 01/07/18 1138      Core Components/Risk Factors/Patient Goals on Admission    Weight Management  Yes;Weight Maintenance    Intervention  Weight Management: Develop a combined nutrition and exercise program designed to reach desired caloric intake, while maintaining appropriate intake of nutrient and fiber, sodium and fats, and appropriate energy expenditure required for the weight goal.;Weight Management: Provide education and appropriate resources to help participant work on and attain dietary goals.;Weight Management/Obesity: Establish reasonable short term and long term weight goals.    Admit Weight  195 lb 12.3 oz (88.8 kg)    Expected Outcomes  Weight Maintenance: Understanding of the daily nutrition guidelines, which includes 25-35% calories from fat, 7% or less cal from saturated fats, less than 200mg  cholesterol, less than 1.5gm of sodium, & 5 or more servings of fruits and vegetables daily;Understanding recommendations for meals to include 15-35% energy as protein, 25-35% energy from fat, 35-60% energy from carbohydrates, less than 200mg  of dietary cholesterol, 20-35 gm of total fiber daily;Understanding of distribution of calorie intake throughout the day with the consumption of 4-5 meals/snacks;Short Term: Continue to assess and modify interventions until short term weight is achieved;Long Term: Adherence to nutrition and physical activity/exercise  program aimed toward attainment of established weight goal    Improve shortness of breath with ADL's  Yes    Intervention  Provide education, individualized exercise plan and daily activity instruction to help decrease symptoms of SOB with activities of daily living.    Expected Outcomes  Short Term: Improve cardiorespiratory fitness to achieve a reduction of symptoms when performing ADLs;Long Term: Be able to perform more ADLs without symptoms or delay the onset of symptoms       Core Components/Risk Factors/Patient Goals Review:    Core Components/Risk Factors/Patient Goals at Discharge (Final Review):    ITP Comments: ITP Comments    Row Name 01/07/18 1004           ITP Comments  Dr. Charlestine Massed, Medical Director Pulmonary Rehab          Comments:

## 2018-01-12 ENCOUNTER — Telehealth: Payer: Self-pay | Admitting: Pulmonary Disease

## 2018-01-12 NOTE — Telephone Encounter (Signed)
Attempted to call Morrie Sheldon with [employer] at [phone] in regards to B.Mack recommendations below. I did not receive an answer at time of call. I have left a voicemail message for pt to return call. X1

## 2018-01-12 NOTE — Telephone Encounter (Signed)
Returned call to Hurley at [place].  He stated that they need something in last note from Elisha Headland, NP, who ordered the O2, that Patient was not on Prednisone at the time O2 was ordered. If  Patient was started on Prednisone at that OV, it is considered acute visit.  Morrie Sheldon stated that [facility] at Cardiac rehab, stated on the walk test they had done, Patient qualified for O2 at all times.  Will route to [facility]

## 2018-01-12 NOTE — Telephone Encounter (Signed)
Patient is still on nocturnal oxygen.  Patient has been transferred care to [facility] pulmonology.  Patient is currently being worked up for lung transplant.  I believe the patient still needs oxygen at night.  We are no longer managing his pulmonary needs as he is now with [facility] pulmonology in [place].  Please let me know if there is something you need in our office note.  Elisha Headland, FNP

## 2018-01-13 ENCOUNTER — Telehealth: Payer: Self-pay | Admitting: Pulmonary Disease

## 2018-01-13 NOTE — Telephone Encounter (Signed)
Spoke with Marchelle Folks with [employer]. She is aware that this has been taken care of. Nothing further was needed at this time.

## 2018-01-13 NOTE — Telephone Encounter (Signed)
Spoke with Morrie Sheldon with [employer]. He is aware of Saud's response. Nothing further was needed at this time.

## 2018-01-13 NOTE — Telephone Encounter (Signed)
Spoke with Timothy Andrews at [place]. In order for the pt's oxygen to be approved by his insurance, Khadim will need to remove the information in the 12/04/17 office visit note about the pt's prednisone usage.  Eliga - please advise. Thanks!

## 2018-01-13 NOTE — Telephone Encounter (Signed)
I have addended the note to remove from the A and P portion that he did not feel that prednisone helped.    I am unsure what else [employer] needs in order to qualify this patient for oxygen.  He had severe lung injury and is being worked up for a lung transplant he needs to have oxygen 2 L at night this is been proven via ono.  Maybe they need to try additional diagnosis codes.    Please let me know if there is any other concerns.  As it would be very unfortunate if a 22 year old patient who is already had a difficult 2019 had difficulty obtaining his oxygen as he is being worked up for a potential lung transplant.  Elisha Headland, FNP

## 2018-01-14 ENCOUNTER — Encounter (HOSPITAL_COMMUNITY): Payer: Self-pay | Admitting: *Deleted

## 2018-01-15 ENCOUNTER — Encounter (HOSPITAL_COMMUNITY)
Admission: RE | Admit: 2018-01-15 | Discharge: 2018-01-15 | Disposition: A | Discharge status: Home / Self Care | Payer: 59 | Source: Ambulatory Visit | Attending: Pulmonary Disease | Admitting: Pulmonary Disease

## 2018-01-15 DIAGNOSIS — J849 Interstitial pulmonary disease, unspecified: Secondary | ICD-10-CM | POA: Insufficient documentation

## 2018-01-15 DIAGNOSIS — Z79899 Other long term (current) drug therapy: Secondary | ICD-10-CM | POA: Insufficient documentation

## 2018-01-15 DIAGNOSIS — Z87891 Personal history of nicotine dependence: Secondary | ICD-10-CM | POA: Insufficient documentation

## 2018-01-15 DIAGNOSIS — Z7951 Long term (current) use of inhaled steroids: Secondary | ICD-10-CM | POA: Insufficient documentation

## 2018-01-15 NOTE — Progress Notes (Signed)
Daily Session Note  Patient Details  Name: Timothy Andrews MRN: [id number] Date of Birth: Apr 12, 1996 Referring Provider:     Pulmonary Rehab Walk Test from 01/08/2018 in [place]  Referring Provider  Dr. Nelda Marseille      Encounter Date: 01/15/2018  Check In: Session Check In - 01/15/18 1230      Check-In   Supervising physician immediately available to respond to emergencies  [facility] immediately available    Physician(s)  Dr. Horris Latino    Location  [facility]    Staff Present  Maurice Small, RN, BSN;Zaryia Markel, MS, ACSM RCEP, Exercise Physiologist;Lisa Ysidro Evert, Felipe Drone, RN, MHA    Medication changes reported      No    Fall or balance concerns reported     No    Tobacco Cessation  No Change    Warm-up and Cool-down  Not performed (comment)    Resistance Training Performed  Yes    VAD Patient?  No    PAD/SET Patient?  No      Pain Assessment   Currently in Pain?  No/denies    Multiple Pain Sites  No       Capillary Blood Glucose: No results found for this or any previous visit (from the past 24 hour(s)).    Social History   Tobacco Use  Smoking Status Former Smoker  . Packs/day: 0.25  . Years: 3.00  . Pack years: 0.75  . Types: Cigars  . Last attempt to quit: 07/13/2017  . Years since quitting: 0.5  Smokeless Tobacco Never Used    Goals Met:  Exercise tolerated well  Goals Unmet:  Not Applicable  Comments: Service time is from 10:30a to 12:15p    Dr. Rush Farmer is Medical Director for Pulmonary Rehab at [facility].

## 2018-01-16 ENCOUNTER — Telehealth: Payer: Self-pay | Admitting: Pulmonary Disease

## 2018-01-16 NOTE — Telephone Encounter (Signed)
Called and spoke with Marchelle Folks at [place] she states prednisone is still listed in the AVS on the 07/25 plan.   BM please advise once AVS as been updated Thanks.

## 2018-01-18 NOTE — Telephone Encounter (Signed)
As previously stated. [employer] can contact me. I not going to continue to go back and forth and further augment a previous note. The patient being on prednisone chronically is not and should not be a disqualifier for him to be on oxygen. They need to contact me via clinic ours and I can speak with them directly.   Elisha Headland FNP

## 2018-01-19 NOTE — Telephone Encounter (Signed)
Attempted to contact [employer]. Their office is currently closed. Will try back later this morning.

## 2018-01-19 NOTE — Telephone Encounter (Signed)
Attempted to contact [employer]. I was on a 5+ minute hold. Will try back.

## 2018-01-20 ENCOUNTER — Encounter (HOSPITAL_COMMUNITY)
Admission: RE | Admit: 2018-01-20 | Discharge: 2018-01-20 | Disposition: A | Discharge status: Home / Self Care | Payer: 59 | Source: Ambulatory Visit | Attending: Pulmonary Disease | Admitting: Pulmonary Disease

## 2018-01-20 DIAGNOSIS — J849 Interstitial pulmonary disease, unspecified: Secondary | ICD-10-CM | POA: Diagnosis not present

## 2018-01-20 NOTE — Progress Notes (Signed)
Daily Session Note  Patient Details  Name: Timothy Andrews MRN: [id number] Date of Birth: 23-Aug-1995 Referring Provider:     Pulmonary Rehab Walk Test from 01/08/2018 in [place]  Referring Provider  Dr. Nelda Marseille      Encounter Date: 01/20/2018  Check In: Session Check In - 01/20/18 1202      Check-In   Supervising physician immediately available to respond to emergencies  [facility] immediately available    Physician(s)  Dr. Horris Latino    Location  [facility]    Staff Present  Su Hilt, MS, ACSM RCEP, Exercise Physiologist;Alexander Mcauley Colletta [place], RN, MHA    Medication changes reported      No    Fall or balance concerns reported     No    Tobacco Cessation  No Change    Warm-up and Cool-down  Performed as group-led instruction    Resistance Training Performed  Yes    VAD Patient?  No    PAD/SET Patient?  No      Pain Assessment   Currently in Pain?  No/denies       Capillary Blood Glucose: No results found for this or any previous visit (from the past 24 hour(s)).    Social History   Tobacco Use  Smoking Status Former Smoker  . Packs/day: 0.25  . Years: 3.00  . Pack years: 0.75  . Types: Cigars  . Last attempt to quit: 07/13/2017  . Years since quitting: 0.5  Smokeless Tobacco Never Used    Goals Met:  Exercise tolerated well No report of cardiac concerns or symptoms Strength training completed today  Goals Unmet:  Not Applicable  Comments: Service time is from 1030 to 1210    Dr. Rush Farmer is Medical Director for Pulmonary Rehab at [facility].

## 2018-01-20 NOTE — Telephone Encounter (Signed)
Called [facility], spoke with Rod Holler, she stated that Marchelle Folks will not be in the office until later this afternoon. I have left a message for Marchelle Folks with Rod Holler to give [place] a call back.

## 2018-01-21 NOTE — Telephone Encounter (Signed)
Called [facility] and spoke with Marchelle Folks to see if anything else needed to be done in regards to the OV from 8/22  Stated to [place] per Arlys [name], pt being on prednisone should not be a disqualifier for pt to be on O2.  Per Marchelle Folks, the prednisone written on the AVS from 8/22 is written as a taper and per Marchelle Folks, this needs to be removed from the med list from 8/22. Per Marchelle Folks, this needs to be removed off of pt's med list from the AVS on 8/22 as how it is written will be a disqualifier for insurance to cover O2 for pt.  Routing to Elisha Headland, NP.  Phone number for [facility] where Marchelle Folks can be reached is [phone]

## 2018-01-21 NOTE — Telephone Encounter (Signed)
Timothy Andrews is calling back [phone]

## 2018-01-21 NOTE — Telephone Encounter (Signed)
Called [facility] and spoke with Marchelle Folks - the issue is NOT with the AVS but instead with the problem list within the 8/22 office note >> patient was documented to be seen for follow up and O2 order but the med list within that note reflects that patient was taking prednisone taper at time of visit.  Pred taper indicates an acute exacerbation and therefore insurance will not pay for O2.    Marchelle Folks asks that the 8/22 note be addended (NOT CHANGED) just addended to state that patient was not taking pred taper at the time of the visit.  Advised Marchelle Folks will speak with Arlys [name] NP to see if this can be done.  Spoke with Arlys [name] NP, discussed the above. 8/22 ov note addended by Arlys [name] NP as requested by Dewayne Shorter and spoke with Leotis Shames Marchelle Folks was not available) and informed her that Arlys [name] NP addended the 8/22 ov note as requested.  Nothing further needed at this time.

## 2018-01-22 ENCOUNTER — Encounter (HOSPITAL_COMMUNITY)
Admission: RE | Admit: 2018-01-22 | Discharge: 2018-01-22 | Disposition: A | Discharge status: Home / Self Care | Payer: 59 | Source: Ambulatory Visit | Attending: Pulmonary Disease | Admitting: Pulmonary Disease

## 2018-01-22 DIAGNOSIS — J849 Interstitial pulmonary disease, unspecified: Secondary | ICD-10-CM | POA: Diagnosis not present

## 2018-01-22 NOTE — Progress Notes (Signed)
Daily Session Note  Patient Details  Name: Timothy Andrews MRN: [id number] Date of Birth: [birth year] Referring Provider:     Pulmonary Rehab Walk Test from 01/08/2018 in [place]  Referring Provider  Dr. Nelda Marseille      Encounter Date: 01/22/2018  Check In: Session Check In - 01/22/18 1046      Check-In   Supervising physician immediately available to respond to emergencies  [facility] immediately available    Physician(s)  Dr. Herbert Moors    Location  [facility]    Staff Present  Su Hilt, MS, ACSM RCEP, Exercise Physiologist;Lisa Colletta [place], RN, MHA;Johnnathan Hagemeister Kris Mouton, MS, Exercise Physiologist    Medication changes reported      No    Fall or balance concerns reported     No    Tobacco Cessation  No Change    Warm-up and Cool-down  Performed as group-led instruction    Resistance Training Performed  Yes    VAD Patient?  No    PAD/SET Patient?  No      Pain Assessment   Currently in Pain?  No/denies    Multiple Pain Sites  No       Capillary Blood Glucose: No results found for this or any previous visit (from the past 24 hour(s)).    Social History   Tobacco Use  Smoking Status Former Smoker  . Packs/day: 0.25  . Years: 3.00  . Pack years: 0.75  . Types: Cigars  . Last attempt to quit: 07/13/2017  . Years since quitting: 0.5  Smokeless Tobacco Never Used    Goals Met:  Exercise tolerated well  Goals Unmet:  Not Applicable  Comments: Service time is from 10:30a to 12:40p   Dr. Rush Farmer is Medical Director for Pulmonary Rehab at [facility].

## 2018-01-27 ENCOUNTER — Encounter (HOSPITAL_COMMUNITY)
Admission: RE | Admit: 2018-01-27 | Discharge: 2018-01-27 | Disposition: A | Discharge status: Home / Self Care | Payer: 59 | Source: Ambulatory Visit | Attending: Pulmonary Disease | Admitting: Pulmonary Disease

## 2018-01-27 VITALS — Wt 195.1 lb

## 2018-01-27 DIAGNOSIS — J849 Interstitial pulmonary disease, unspecified: Secondary | ICD-10-CM

## 2018-01-27 NOTE — Telephone Encounter (Signed)
Thank you for your hardwork on this [name].   Timothy Andrews

## 2018-01-27 NOTE — Progress Notes (Signed)
Daily Session Note  Patient Details  Name: Timothy Andrews MRN: [id number] Date of Birth: 1995-09-23 Referring Provider:     Pulmonary Rehab Walk Test from 01/08/2018 in [place]  Referring Provider  Dr. Nelda Marseille      Encounter Date: 01/27/2018  Check In: Session Check In - 01/27/18 1024      Check-In   Supervising physician immediately available to respond to emergencies  [facility] immediately available    Physician(s)  Dr. Herbert Moors    Location  [facility]    Staff Present  Su Hilt, MS, ACSM RCEP, Exercise Physiologist;Annedrea Rosezella [place], RN, MHA;Lisa Ysidro Evert, RN;Dalton Fletcher, MS, Exercise Physiologist    Medication changes reported      No    Fall or balance concerns reported     No    Tobacco Cessation  No Change    Warm-up and Cool-down  Performed as group-led instruction    Resistance Training Performed  Yes    VAD Patient?  No    PAD/SET Patient?  No      Pain Assessment   Currently in Pain?  No/denies    Multiple Pain Sites  No       Capillary Blood Glucose: No results found for this or any previous visit (from the past 24 hour(s)).  Exercise Prescription Changes - 01/27/18 1200      Response to Exercise   Blood Pressure (Admit)  143/88    Blood Pressure (Exercise)  158/72    Blood Pressure (Exit)  130/90    Heart Rate (Admit)  60 bpm    Heart Rate (Exercise)  114 bpm    Heart Rate (Exit)  75 bpm    Oxygen Saturation (Admit)  95 %    Oxygen Saturation (Exercise)  91 %    Oxygen Saturation (Exit)  95 %    Rating of Perceived Exertion (Exercise)  13    Perceived Dyspnea (Exercise)  2    Duration  Progress to 45 minutes of aerobic exercise without signs/symptoms of physical distress    Intensity  THRR unchanged      Resistance Training   Training Prescription  Yes    Weight  blue bands    Reps  10-15      Treadmill   MPH  2.2    Grade  2    Minutes  17      Bike   Level  0.8    Minutes  17      Rower   Level  2    Watts  40    Minutes  17       Social History   Tobacco Use  Smoking Status Former Smoker  . Packs/day: 0.25  . Years: 3.00  . Pack years: 0.75  . Types: Cigars  . Last attempt to quit: 07/13/2017  . Years since quitting: 0.5  Smokeless Tobacco Never Used    Goals Met:  Exercise tolerated well  Goals Unmet:  Not Applicable  Comments: Service time is from 10:30A to 12:00P    Dr. Rush Farmer is Medical Director for Pulmonary Rehab at [facility].

## 2018-01-28 NOTE — Progress Notes (Signed)
Pulmonary Individual Treatment Plan  Patient Details  Name: Timothy Andrews MRN: [id number] Date of Birth: 08/13/1995 Referring Provider:     Pulmonary Rehab Walk Test from 01/08/2018 in [facility] CARDIAC [facility]  Referring Provider  Dr. Molli Knock      Initial Encounter Date:    Pulmonary Rehab Walk Test from 01/08/2018 in [facility] CARDIAC REHAB  Date  01/08/18      Visit Diagnosis: ILD (interstitial lung disease) (HCC)  Patient's Home Medications on Admission:   Current Outpatient Medications:  .  albuterol (PROVENTIL) (2.5 MG/3ML) 0.083% nebulizer solution, Take 3 mLs (2.5 mg total) by nebulization every 4 (four) hours as needed for wheezing or shortness of breath., Disp: 75 mL, Rfl: 12 .  budesonide-formoterol (SYMBICORT) 160-4.5 MCG/ACT inhaler, Inhale 2 puffs into the lungs 2 (two) times daily., Disp: 1 Inhaler, Rfl: 11 .  budesonide-formoterol (SYMBICORT) 160-4.5 MCG/ACT inhaler, Inhale 2 puffs into the lungs every 12 (twelve) hours., Disp: 1 Inhaler, Rfl: 6 .  Respiratory Therapy Supplies (FLUTTER) DEVI, Use as directed, Disp: 1 each, Rfl: 0 .  Tiotropium Bromide Monohydrate (SPIRIVA RESPIMAT) 2.5 MCG/ACT AERS, Inhale 2 puffs into the lungs daily., Disp: 1 Inhaler, Rfl: 5  Past Medical History: No past medical history on file.  Tobacco Use: Social History   Tobacco Use  Smoking Status Former Smoker  . Packs/day: 0.25  . Years: 3.00  . Pack years: 0.75  . Types: Cigars  . Last attempt to quit: 07/13/2017  . Years since quitting: 0.5  Smokeless Tobacco Never Used    Labs: Recent Review Advice worker    Labs for ITP Cardiac and Pulmonary Rehab Latest Ref Rng & Units 07/13/2017 07/13/2017 07/13/2017 07/13/2017   PHART 7.350 - 7.450 7.219(L) 7.247(L) - -   PCO2ART 32.0 - 48.0 mmHg 51.2(H) 51.9(H) - -   HCO3 20.0 - 28.0 mmol/L 20.1 22.6 19.4(L) -   TCO2 22 - 32 mmol/L - 24 21(L) -   ACIDBASEDEF 0.0 - 2.0 mmol/L 6.3(H) 5.0(H) 9.0(H) -     O2SAT % 70.9 99.0 27.0 70.9      Capillary Blood Glucose: Lab Results  Component Value Date   GLUCAP 212 (H) 07/13/2017     Pulmonary Assessment Scores: Pulmonary Assessment Scores    Row Name 01/09/18 1005 01/14/18 1452       ADL UCSD   ADL Phase  Entry  Entry    SOB Score total  -  68      CAT Score   CAT Score  -  19      mMRC Score   mMRC Score  2  -       Pulmonary Function Assessment:   Exercise Target Goals: Exercise Program Goal: Individual exercise prescription set using results from initial 6 min walk test and THRR while considering  patient's activity barriers and safety.   Exercise Prescription Goal: Initial exercise prescription builds to 30-45 minutes a day of aerobic activity, 2-3 days per week.  Home exercise guidelines will be given to patient during program as part of exercise prescription that the participant will acknowledge.  Activity Barriers & Risk Stratification: Activity Barriers & Cardiac Risk Stratification - 01/07/18 1036      Activity Barriers & Cardiac Risk Stratification   Activity Barriers  Shortness of Breath;Back Problems   l knee hyperextended February 10th      6 Minute Walk: 6 Minute Walk    Row Name 01/09/18 1004  6 Minute Walk   Phase  Initial     Distance  1275 feet     Walk Time  6 minutes     # of Rest Breaks  0     MPH  2.41     METS  2.84     RPE  12     Perceived Dyspnea   1     Symptoms  No     Resting HR  73 bpm     Resting BP  134/84     Resting Oxygen Saturation   96 %     Exercise Oxygen Saturation  during 6 min walk  88 %     Max Ex. HR  77 bpm     Max Ex. BP  140/80       Interval HR   1 Minute HR  76     2 Minute HR  77     3 Minute HR  77     4 Minute HR  75     5 Minute HR  75     6 Minute HR  75     2 Minute Post HR  72     Interval Heart Rate?  Yes       Interval Oxygen   Interval Oxygen?  Yes     Baseline Oxygen Saturation %  96 %     1 Minute Oxygen Saturation %  94  %     1 Minute Liters of Oxygen  0 L     2 Minute Oxygen Saturation %  92 %     2 Minute Liters of Oxygen  0 L     3 Minute Oxygen Saturation %  89 %     3 Minute Liters of Oxygen  0 L     4 Minute Oxygen Saturation %  88 %     4 Minute Liters of Oxygen  0 L     5 Minute Oxygen Saturation %  90 %     5 Minute Liters of Oxygen  0 L     6 Minute Oxygen Saturation %  88 %     6 Minute Liters of Oxygen  0 L     2 Minute Post Oxygen Saturation %  96 %     2 Minute Post Liters of Oxygen  0 L        Oxygen Initial Assessment: Oxygen Initial Assessment - 01/09/18 1005      Initial 6 min Walk   Oxygen Used  None      Program Oxygen Prescription   Program Oxygen Prescription  None       Oxygen Re-Evaluation: Oxygen Re-Evaluation    Row Name 01/27/18 1018             Program Oxygen Prescription   Program Oxygen Prescription  None         Home Oxygen   Home Oxygen Device  None       Sleep Oxygen Prescription  None       Home Exercise Oxygen Prescription  None       Home at Rest Exercise Oxygen Prescription  None       Compliance with Home Oxygen Use  No          Oxygen Discharge (Final Oxygen Re-Evaluation): Oxygen Re-Evaluation - 01/27/18 1018      Program Oxygen Prescription   Program Oxygen Prescription  None  Home Oxygen   Home Oxygen Device  None    Sleep Oxygen Prescription  None    Home Exercise Oxygen Prescription  None    Home at Rest Exercise Oxygen Prescription  None    Compliance with Home Oxygen Use  No       Initial Exercise Prescription: Initial Exercise Prescription - 01/09/18 1000      Date of Initial Exercise RX and Referring Provider   Date  01/08/18    Referring Provider  Dr. Molli Knock      Treadmill   MPH  2.5    Grade  3    Minutes  17      Bike   Level  0.8    Minutes  17      Rower   Level  2    Watts  40    Minutes  17      Prescription Details   Frequency (times per week)  2    Duration  Progress to 45 minutes of  aerobic exercise without signs/symptoms of physical distress      Intensity   THRR 40-80% of Max Heartrate  79-158    Ratings of Perceived Exertion  11-13    Perceived Dyspnea  0-4      Progression   Progression  Continue progressive overload as per policy without signs/symptoms or physical distress.      Resistance Training   Training Prescription  Yes    Weight  blue bands    Reps  10-15       Perform Capillary Blood Glucose checks as needed.  Exercise Prescription Changes: Exercise Prescription Changes    Row Name 01/27/18 1200             Response to Exercise   Blood Pressure (Admit)  143/88       Blood Pressure (Exercise)  158/72       Blood Pressure (Exit)  130/90       Heart Rate (Admit)  60 bpm       Heart Rate (Exercise)  114 bpm       Heart Rate (Exit)  75 bpm       Oxygen Saturation (Admit)  95 %       Oxygen Saturation (Exercise)  91 %       Oxygen Saturation (Exit)  95 %       Rating of Perceived Exertion (Exercise)  13       Perceived Dyspnea (Exercise)  2       Duration  Progress to 45 minutes of aerobic exercise without signs/symptoms of physical distress       Intensity  THRR unchanged         Resistance Training   Training Prescription  Yes       Weight  blue bands       Reps  10-15         Treadmill   MPH  2.2       Grade  2       Minutes  17         Bike   Level  0.8       Minutes  17         Rower   Level  2       Watts  40       Minutes  17          Exercise Comments:   Exercise Goals and Review: Exercise Goals  Row Name 01/07/18 1018             Exercise Goals   Increase Physical Activity  Yes       Intervention  Provide advice, education, support and counseling about physical activity/exercise needs.;Develop an individualized exercise prescription for aerobic and resistive training based on initial evaluation findings, risk stratification, comorbidities and participant's personal goals.       Expected Outcomes   Short Term: Attend rehab on a regular basis to increase amount of physical activity.;Long Term: Exercising regularly at least 3-5 days a week.;Long Term: Add in home exercise to make exercise part of routine and to increase amount of physical activity.       Increase Strength and Stamina  Yes       Intervention  Provide advice, education, support and counseling about physical activity/exercise needs.;Develop an individualized exercise prescription for aerobic and resistive training based on initial evaluation findings, risk stratification, comorbidities and participant's personal goals.       Expected Outcomes  Short Term: Increase workloads from initial exercise prescription for resistance, speed, and METs.;Short Term: Perform resistance training exercises routinely during rehab and add in resistance training at home;Long Term: Improve cardiorespiratory fitness, muscular endurance and strength as measured by increased METs and functional capacity ( )       Able to understand and use rate of perceived exertion (RPE) scale  Yes       Intervention  Provide education and explanation on how to use RPE scale       Expected Outcomes  Short Term: Able to use RPE daily in rehab to express subjective intensity level;Long Term:  Able to use RPE to guide intensity level when exercising independently       Able to understand and use Dyspnea scale  Yes       Intervention  Provide education and explanation on how to use Dyspnea scale       Expected Outcomes  Short Term: Able to use Dyspnea scale daily in rehab to express subjective sense of shortness of breath during exertion;Long Term: Able to use Dyspnea scale to guide intensity level when exercising independently       Knowledge and understanding of Target Heart Rate Range (THRR)  Yes       Intervention  Provide education and explanation of THRR including how the numbers were predicted and where they are located for reference       Expected Outcomes  Short Term:  Able to state/look up THRR;Long Term: Able to use THRR to govern intensity when exercising independently;Short Term: Able to use daily as guideline for intensity in rehab       Understanding of Exercise Prescription  Yes       Intervention  Provide education, explanation, and written materials on patient's individual exercise prescription       Expected Outcomes  Short Term: Able to explain program exercise prescription;Long Term: Able to explain home exercise prescription to exercise independently          Exercise Goals Re-Evaluation : Exercise Goals Re-Evaluation    Row Name 01/27/18 1018             Exercise Goal Re-Evaluation   Exercise Goals Review  Increase Physical Activity;Increase Strength and Stamina;Able to understand and use rate of perceived exertion (RPE) scale;Able to understand and use Dyspnea scale;Knowledge and understanding of Target Heart Rate Range (THRR);Understanding of Exercise Prescription       Comments  Patient has only attended 3 rehab  sessions. Patient is very gaurded and hesitant to workload changes. Takes numerous breaks during exercise. Will cont. to monitor and motivate as able.       Expected Outcomes  Through exercise at rehab and at home, patient will increase physicial capacity and ADL's will be easier to perform at home. Patient will also develop exercise regime at home.          Discharge Exercise Prescription (Final Exercise Prescription Changes): Exercise Prescription Changes - 01/27/18 1200      Response to Exercise   Blood Pressure (Admit)  143/88    Blood Pressure (Exercise)  158/72    Blood Pressure (Exit)  130/90    Heart Rate (Admit)  60 bpm    Heart Rate (Exercise)  114 bpm    Heart Rate (Exit)  75 bpm    Oxygen Saturation (Admit)  95 %    Oxygen Saturation (Exercise)  91 %    Oxygen Saturation (Exit)  95 %    Rating of Perceived Exertion (Exercise)  13    Perceived Dyspnea (Exercise)  2    Duration  Progress to 45 minutes of  aerobic exercise without signs/symptoms of physical distress    Intensity  THRR unchanged      Resistance Training   Training Prescription  Yes    Weight  blue bands    Reps  10-15      Treadmill   MPH  2.2    Grade  2    Minutes  17      Bike   Level  0.8    Minutes  17      Rower   Level  2    Watts  40    Minutes  17       Nutrition:  Target Goals: Understanding of nutrition guidelines, daily intake of sodium 1500mg , cholesterol 200mg , calories 30% from fat and 7% or less from saturated fats, daily to have 5 or more servings of fruits and vegetables.  Biometrics:    Nutrition Therapy Plan and Nutrition Goals:   Nutrition Assessments:   Nutrition Goals Re-Evaluation:   Nutrition Goals Discharge (Final Nutrition Goals Re-Evaluation):   Psychosocial: Target Goals: Acknowledge presence or absence of significant depression and/or stress, maximize coping skills, provide positive support system. Participant is able to verbalize types and ability to use techniques and skills needed for reducing stress and depression.  Initial Review & Psychosocial Screening: Initial Psych Review & Screening - 01/07/18 1518      Initial Review   Comments  pt has had to move back home with his mom and this has caused some intra familial strain, pt shortness of breath keeps him from being able to do ADL and work.      Family Dynamics   Comments  family has been supportive during his recovery      Barriers   Psychosocial barriers to participate in program  The patient should benefit from training in stress management and relaxation.       Quality of Life Scores:  Scores of 19 and below usually indicate a poorer quality of life in these areas.  A difference of  2-3 points is a clinically meaningful difference.  A difference of 2-3 points in the total score of the Quality of Life Index has been associated with significant improvement in overall quality of life, self-image,  physical symptoms, and general health in studies assessing change in quality of life.  PHQ-9: Recent Review Flowsheet Data  Depression screen [facility] 2/9 01/07/2018 01/07/2018 01/17/2017 01/09/2017   Decreased Interest - 0 0 0   Down, Depressed, Hopeless - 2 0 0   PHQ - 2 Score - 2 0 0   Altered sleeping - 0 - -   Tired, decreased energy - 0 - -   Change in appetite - 0 - -   Feeling bad or failure about yourself  - 3 - -   Trouble concentrating - 0 - -   Moving slowly or fidgety/restless - 0 - -   Suicidal thoughts (No Data)  1 - -   PHQ-9 Score - 6 - -   Difficult doing work/chores - Very difficult - -     Interpretation of Total Score  Total Score Depression Severity:  1-4 = Minimal depression, 5-9 = Mild depression, 10-14 = Moderate depression, 15-19 = Moderately severe depression, 20-27 = Severe depression   Psychosocial Evaluation and Intervention: Psychosocial Evaluation - 01/28/18 1718      Psychosocial Evaluation & Interventions   Interventions  Stress management education;Relaxation education;Encouraged to exercise with the program and follow exercise prescription    Comments  Pt feels stressed regarding his disease process and his living situation.  Pt has started a new job which will hopefully give him some financial stability and reduce his stress.    Expected Outcomes  Pt will be able to demonstrate positive and healthy coping skill to deal with life stressors.    Continue Psychosocial Services   Follow up required by staff       Psychosocial Re-Evaluation: Psychosocial Re-Evaluation    Row Name 01/28/18 1720             Psychosocial Re-Evaluation   Current issues with  Current Depression;Current Stress Concerns       Comments  Pt is doing well in exercise at pulmonary rehab with the completion of 4 sessions.  Encourage exercise and attendance to educational classes.       Expected Outcomes  Pt will demonstrate positive and healthy coping skills.  Pt will utilize  community resources such as vocational rehab to regain his independence.       Interventions  Relaxation education;Encouraged to attend Pulmonary Rehabilitation for the exercise;Stress management education       Continue Psychosocial Services   Follow up required by staff       Comments  pt has had to move back home with his mom and this has caused some intra familial strain, pt shortness of breath keeps him from being able to do ADL and work.         Initial Review   Source of Stress Concerns  Unable to perform yard/household activities;Financial;Family;Occupation;Unable to participate in former interests or hobbies          Psychosocial Discharge (Final Psychosocial Re-Evaluation): Psychosocial Re-Evaluation - 01/28/18 1720      Psychosocial Re-Evaluation   Current issues with  Current Depression;Current Stress Concerns    Comments  Pt is doing well in exercise at pulmonary rehab with the completion of 4 sessions.  Encourage exercise and attendance to educational classes.    Expected Outcomes  Pt will demonstrate positive and healthy coping skills.  Pt will utilize community resources such as vocational rehab to regain his independence.    Interventions  Relaxation education;Encouraged to attend Pulmonary Rehabilitation for the exercise;Stress management education    Continue Psychosocial Services   Follow up required by staff    Comments  pt has had to  move back home with his mom and this has caused some intra familial strain, pt shortness of breath keeps him from being able to do ADL and work.      Initial Review   Source of Stress Concerns  Unable to perform yard/household activities;Financial;Family;Occupation;Unable to participate in former interests or hobbies       Education: Education Goals: Education classes will be provided on a weekly basis, covering required topics. Participant will state understanding/return demonstration of topics presented.  Learning  Barriers/Preferences: Learning Barriers/Preferences - 01/07/18 1035      Learning Barriers/Preferences   Learning Barriers  None    Learning Preferences  Skilled Demonstration;Group Instruction;Video;Written Material       Education Topics: Risk Factor Reduction:  -Group instruction that is supported by a PowerPoint presentation. Instructor discusses the definition of a risk factor, different risk factors for pulmonary disease, and how the heart and lungs work together.     Nutrition for Pulmonary Patient:  -Group instruction provided by PowerPoint slides, verbal discussion, and written materials to support subject matter. The instructor gives an explanation and review of healthy diet recommendations, which includes a discussion on weight management, recommendations for fruit and vegetable consumption, as well as protein, fluid, caffeine, fiber, sodium, sugar, and alcohol. Tips for eating when patients are short of breath are discussed.   Pursed Lip Breathing:  -Group instruction that is supported by demonstration and informational handouts. Instructor discusses the benefits of pursed lip and diaphragmatic breathing and detailed demonstration on how to preform both.     Oxygen Safety:  -Group instruction provided by PowerPoint, verbal discussion, and written material to support subject matter. There is an overview of "What is Oxygen" and "Why do we need it".  Instructor also reviews how to create a safe environment for oxygen use, the importance of using oxygen as prescribed, and the risks of noncompliance. There is a brief discussion on traveling with oxygen and resources the patient may utilize.   PULMONARY REHAB OTHER RESPIRATORY from 01/22/2018 in [facility] CARDIAC REHAB  Date  01/15/18  Educator  molly  Instruction Review Code  1- Verbalizes Understanding      Oxygen Equipment:  -Group instruction provided by [employer], written  materials, and Chief Technology Officer.   PULMONARY REHAB OTHER RESPIRATORY from 01/22/2018 in [facility] CARDIAC REHAB  Date  01/22/18  Educator  [employer]  Instruction Review Code  1- Verbalizes Understanding      Signs and Symptoms:  -Group instruction provided by written material and verbal discussion to support subject matter. Warning signs and symptoms of infection, stroke, and heart attack are reviewed and when to call the physician/911 reinforced. Tips for preventing the spread of infection discussed.   Advanced Directives:  -Group instruction provided by verbal instruction and written material to support subject matter. Instructor reviews Advanced Directive laws and proper instruction for filling out document.   Pulmonary Video:  -Group video education that reviews the importance of medication and oxygen compliance, exercise, good nutrition, pulmonary hygiene, and pursed lip and diaphragmatic breathing for the pulmonary patient.   Exercise for the Pulmonary Patient:  -Group instruction that is supported by a PowerPoint presentation. Instructor discusses benefits of exercise, core components of exercise, frequency, duration, and intensity of an exercise routine, importance of utilizing pulse oximetry during exercise, safety while exercising, and options of places to exercise outside of rehab.     Pulmonary Medications:  -Verbally interactive group education provided by instructor  with focus on inhaled medications and proper administration.   Anatomy and Physiology of the Respiratory System and Intimacy:  -Group instruction provided by PowerPoint, verbal discussion, and written material to support subject matter. Instructor reviews respiratory cycle and anatomical components of the respiratory system and their functions. Instructor also reviews differences in obstructive and restrictive respiratory diseases with examples of each. Intimacy, Sex, and Sexuality  differences are reviewed with a discussion on how relationships can change when diagnosed with pulmonary disease. Common sexual concerns are reviewed.   MD DAY -A group question and answer session with a medical doctor that allows participants to ask questions that relate to their pulmonary disease state.   OTHER EDUCATION -Group or individual verbal, written, or video instructions that support the educational goals of the pulmonary rehab program.   Holiday Eating Survival Tips:  -Group instruction provided by PowerPoint slides, verbal discussion, and written materials to support subject matter. The instructor gives patients tips, tricks, and techniques to help them not only survive but enjoy the holidays despite the onslaught of food that accompanies the holidays.   Knowledge Questionnaire Score: Knowledge Questionnaire Score - 01/14/18 1452      Knowledge Questionnaire Score   Pre Score  15/18       Core Components/Risk Factors/Patient Goals at Admission: Personal Goals and Risk Factors at Admission - 01/07/18 1138      Core Components/Risk Factors/Patient Goals on Admission    Weight Management  Yes;Weight Maintenance    Intervention  Weight Management: Develop a combined nutrition and exercise program designed to reach desired caloric intake, while maintaining appropriate intake of nutrient and fiber, sodium and fats, and appropriate energy expenditure required for the weight goal.;Weight Management: Provide education and appropriate resources to help participant work on and attain dietary goals.;Weight Management/Obesity: Establish reasonable short term and long term weight goals.    Admit Weight  195 lb 12.3 oz (88.8 kg)    Expected Outcomes  Weight Maintenance: Understanding of the daily nutrition guidelines, which includes 25-35% calories from fat, 7% or less cal from saturated fats, less than 200mg  cholesterol, less than 1.5gm of sodium, & 5 or more servings of fruits and  vegetables daily;Understanding recommendations for meals to include 15-35% energy as protein, 25-35% energy from fat, 35-60% energy from carbohydrates, less than 200mg  of dietary cholesterol, 20-35 gm of total fiber daily;Understanding of distribution of calorie intake throughout the day with the consumption of 4-5 meals/snacks;Short Term: Continue to assess and modify interventions until short term weight is achieved;Long Term: Adherence to nutrition and physical activity/exercise program aimed toward attainment of established weight goal    Improve shortness of breath with ADL's  Yes    Intervention  Provide education, individualized exercise plan and daily activity instruction to help decrease symptoms of SOB with activities of daily living.    Expected Outcomes  Short Term: Improve cardiorespiratory fitness to achieve a reduction of symptoms when performing ADLs;Long Term: Be able to perform more ADLs without symptoms or delay the onset of symptoms       Core Components/Risk Factors/Patient Goals Review:  Goals and Risk Factor Review    Row Name 01/28/18 1722             Core Components/Risk Factors/Patient Goals Review   Personal Goals Review  Weight Management/Obesity;Develop more efficient breathing techniques such as purse lipped breathing and diaphragmatic breathing and practicing self-pacing with activity.;Improve shortness of breath with ADL's;Increase knowledge of respiratory medications and ability to use respiratory devices properly.  Review  Pt has completed 4 exercise sessions and 2 education classes. Pt is doing well in rehab and this has been a tough transition for him due to his very young age in comparison to others.  Encourage pt to remain focused on his personal goals.  Pt weight remains unchanged from 10/3.  Pt has attended edcuation classes on oxygen and durable home equipment. Pt demonstrates good technique with PLB. Will hopefully move toward independence. workloads  airydyne .8. rower level 2 and treadmill 2.2/2.0.  Continue to monitor pt progress during next 30 day review.       Expected Outcomes  See admission goals/outcomes          Core Components/Risk Factors/Patient Goals at Discharge (Final Review):  Goals and Risk Factor Review - 01/28/18 1722      Core Components/Risk Factors/Patient Goals Review   Personal Goals Review  Weight Management/Obesity;Develop more efficient breathing techniques such as purse lipped breathing and diaphragmatic breathing and practicing self-pacing with activity.;Improve shortness of breath with ADL's;Increase knowledge of respiratory medications and ability to use respiratory devices properly.    Review  Pt has completed 4 exercise sessions and 2 education classes. Pt is doing well in rehab and this has been a tough transition for him due to his very young age in comparison to others.  Encourage pt to remain focused on his personal goals.  Pt weight remains unchanged from 10/3.  Pt has attended edcuation classes on oxygen and durable home equipment. Pt demonstrates good technique with PLB. Will hopefully move toward independence. workloads airydyne .8. rower level 2 and treadmill 2.2/2.0.  Continue to monitor pt progress during next 30 day review.    Expected Outcomes  See admission goals/outcomes       ITP Comments: ITP Comments    Row Name 01/07/18 1004 01/28/18 1718         ITP Comments  Dr. Charlestine Massed, Medical Director Pulmonary Rehab  Dr. Charlestine Massed, Medical Director Pulmonary Rehab         Comments:  Pt has completed 4 exercise sessions. Continue to monitor. Alanson Aly, BSN Cardiac and Emergency planning/management officer

## 2018-01-29 ENCOUNTER — Encounter (HOSPITAL_COMMUNITY)
Admission: RE | Admit: 2018-01-29 | Discharge: 2018-01-29 | Disposition: A | Discharge status: Home / Self Care | Payer: 59 | Source: Ambulatory Visit | Attending: Pulmonary Disease | Admitting: Pulmonary Disease

## 2018-01-29 DIAGNOSIS — J849 Interstitial pulmonary disease, unspecified: Secondary | ICD-10-CM

## 2018-01-29 NOTE — Progress Notes (Signed)
Daily Session Note  Patient Details  Name: Timothy Andrews MRN: [id number] Date of Birth: 23-Dec-1995 Referring Provider:     Pulmonary Rehab Walk Test from 01/08/2018 in [place]  Referring Provider  Dr. Nelda Marseille      Encounter Date: 01/29/2018  Check In: Session Check In - 01/29/18 1059      Check-In   Supervising physician immediately available to respond to emergencies  [facility] immediately available    Physician(s)  Dr. Ree Kida    Location  [facility]    Staff Present  Su Hilt, MS, ACSM RCEP, Exercise Physiologist;Annedrea Rosezella Florida, RN, MHA;Lisa Ysidro Evert, RN;Dalton Fletcher, MS, Exercise Physiologist    Medication changes reported      No    Fall or balance concerns reported     No    Tobacco Cessation  No Change    Warm-up and Cool-down  Performed as group-led instruction    Resistance Training Performed  Yes    VAD Patient?  No    PAD/SET Patient?  No      Pain Assessment   Currently in Pain?  No/denies    Multiple Pain Sites  No       Capillary Blood Glucose: No results found for this or any previous visit (from the past 24 hour(s)).    Social History   Tobacco Use  Smoking Status Former Smoker  . Packs/day: 0.25  . Years: 3.00  . Pack years: 0.75  . Types: Cigars  . Last attempt to quit: 07/13/2017  . Years since quitting: 0.5  Smokeless Tobacco Never Used    Goals Met:  Proper associated with RPD/PD & O2 Sat Exercise tolerated well No report of cardiac concerns or symptoms Strength training completed today  Goals Unmet:  Not Applicable  Comments: Service time is from 1030 to 1205    Dr. Rush Farmer is Medical Director for Pulmonary Rehab at [facility].

## 2018-01-29 NOTE — Progress Notes (Signed)
I have reviewed a Home Exercise Prescription with [name] . Timothy Andrews is not currently exercising at home.  The patient was advised to walk 1-2 days a week for 30-45 minutes.  Cosimo and I discussed how to progress their exercise prescription.  The patient stated that their goals were to avoid lung transplant, decrease shortness of breath.  The patient stated that they understand the exercise prescription.  We reviewed exercise guidelines, target heart rate during exercise, RPE Scale, weather conditions, NTG use, endpoints for exercise, warmup and cool down.  Patient is encouraged to come to me with any questions. I will continue to follow up with the patient to assist them with progression and safety.

## 2018-02-03 ENCOUNTER — Encounter (HOSPITAL_COMMUNITY)
Admission: RE | Admit: 2018-02-03 | Discharge: 2018-02-03 | Disposition: A | Discharge status: Home / Self Care | Payer: 59 | Source: Ambulatory Visit | Attending: Pulmonary Disease | Admitting: Pulmonary Disease

## 2018-02-03 DIAGNOSIS — J849 Interstitial pulmonary disease, unspecified: Secondary | ICD-10-CM

## 2018-02-03 NOTE — Progress Notes (Signed)
Daily Session Note  Patient Details  Name: Timothy Andrews MRN: [id number] Date of Birth: 1995/11/28 Referring Provider:     Pulmonary Rehab Walk Test from 01/08/2018 in [place]  Referring Provider  Dr. Nelda Marseille      Encounter Date: 02/03/2018  Check In: Session Check In - 02/03/18 1139      Check-In   Supervising physician immediately available to respond to emergencies  [facility] immediately available    Physician(s)  Dr. Ree Kida    Location  [facility]    Staff Present  Su Hilt, MS, ACSM RCEP, Exercise Physiologist;Annedrea Rosezella Florida, RN, MHA;Junah Yam Ysidro Evert, RN;Dalton Kris Mouton, MS, Exercise Physiologist;Carlette Wilber Oliphant, RN, BSN    Medication changes reported      No    Fall or balance concerns reported     No    Tobacco Cessation  No Change    Warm-up and Cool-down  Performed as group-led instruction    Resistance Training Performed  Yes    VAD Patient?  No    PAD/SET Patient?  No      Pain Assessment   Currently in Pain?  No/denies    Multiple Pain Sites  No       Capillary Blood Glucose: No results found for this or any previous visit (from the past 24 hour(s)).    Social History   Tobacco Use  Smoking Status Former Smoker  . Packs/day: 0.25  . Years: 3.00  . Pack years: 0.75  . Types: Cigars  . Last attempt to quit: 07/13/2017  . Years since quitting: 0.5  Smokeless Tobacco Never Used    Goals Met:  Exercise tolerated well No report of cardiac concerns or symptoms Strength training completed today  Goals Unmet:  Not Applicable  Comments: Service time is from 1030 to 1200    Dr. Rush Farmer is Medical Director for Pulmonary Rehab at [facility].

## 2018-02-05 ENCOUNTER — Encounter (HOSPITAL_COMMUNITY)
Admission: RE | Admit: 2018-02-05 | Discharge: 2018-02-05 | Disposition: A | Discharge status: Home / Self Care | Payer: 59 | Source: Ambulatory Visit | Attending: Pulmonary Disease | Admitting: Pulmonary Disease

## 2018-02-05 DIAGNOSIS — J849 Interstitial pulmonary disease, unspecified: Secondary | ICD-10-CM | POA: Diagnosis not present

## 2018-02-05 NOTE — Progress Notes (Signed)
Daily Session Note  Patient Details  Name: Timothy Andrews MRN: [id number] Date of Birth: [birth year] Referring Provider:     Pulmonary Rehab Walk Test from 01/08/2018 in [place]  Referring Provider  Dr. Nelda Marseille      Encounter Date: 02/05/2018  Check In: Session Check In - 02/05/18 1104      Check-In   Supervising physician immediately available to respond to emergencies  [facility] immediately available    Physician(s)  Dr. Tyrell Antonio    Location  [facility]    Staff Present  Su Hilt, MS, ACSM RCEP, Exercise Physiologist;Annedrea Rosezella Florida, RN, MHA;Lisa Ysidro Evert, RN;Dalton Kris Mouton, MS, Exercise Physiologist;Carlette Wilber Oliphant, RN, BSN    Medication changes reported      No    Fall or balance concerns reported     No    Tobacco Cessation  No Change    Warm-up and Cool-down  Performed as group-led instruction    Resistance Training Performed  Yes    VAD Patient?  No    PAD/SET Patient?  No      Pain Assessment   Currently in Pain?  No/denies    Multiple Pain Sites  No       Capillary Blood Glucose: No results found for this or any previous visit (from the past 24 hour(s)).    Social History   Tobacco Use  Smoking Status Former Smoker  . Packs/day: 0.25  . Years: 3.00  . Pack years: 0.75  . Types: Cigars  . Last attempt to quit: 07/13/2017  . Years since quitting: 0.5  Smokeless Tobacco Never Used    Goals Met:  Proper associated with RPD/PD & O2 Sat Exercise tolerated well  Goals Unmet:  Not Applicable  Comments: Service time is from 1030 to 1230.    Dr. Rush Farmer is Medical Director for Pulmonary Rehab at [facility].

## 2018-02-10 ENCOUNTER — Encounter (HOSPITAL_COMMUNITY)
Admission: RE | Admit: 2018-02-10 | Discharge: 2018-02-10 | Disposition: A | Discharge status: Home / Self Care | Payer: 59 | Source: Ambulatory Visit | Attending: Pulmonary Disease | Admitting: Pulmonary Disease

## 2018-02-10 VITALS — Wt 193.6 lb

## 2018-02-10 DIAGNOSIS — J849 Interstitial pulmonary disease, unspecified: Secondary | ICD-10-CM | POA: Diagnosis not present

## 2018-02-10 NOTE — Progress Notes (Signed)
Daily Session Note  Patient Details  Name: Timothy Andrews MRN: [id number] Date of Birth: 26-May-1995 Referring Provider:     Pulmonary Rehab Walk Test from 01/08/2018 in [place]  Referring Provider  Dr. Nelda Marseille      Encounter Date: 02/10/2018  Check In: Session Check In - 02/10/18 1207      Check-In   Supervising physician immediately available to respond to emergencies  [facility] immediately available    Physician(s)  Dr. Tyrell Antonio    Location  [facility]    Staff Present  Su Hilt, MS, ACSM RCEP, Exercise Physiologist;Annedrea Rosezella Florida, RN, MHA;Lisa Ysidro Evert, RN;Dalton Kris Mouton, MS, Exercise Physiologist;Carlette Wilber Oliphant, RN, BSN    Medication changes reported      No    Fall or balance concerns reported     No    Tobacco Cessation  No Change    Warm-up and Cool-down  Performed as group-led instruction    Resistance Training Performed  Yes    VAD Patient?  No    PAD/SET Patient?  No      Pain Assessment   Currently in Pain?  No/denies    Multiple Pain Sites  No       Capillary Blood Glucose: No results found for this or any previous visit (from the past 24 hour(s)).    Social History   Tobacco Use  Smoking Status Former Smoker  . Packs/day: 0.25  . Years: 3.00  . Pack years: 0.75  . Types: Cigars  . Last attempt to quit: 07/13/2017  . Years since quitting: 0.5  Smokeless Tobacco Never Used    Goals Met:  Personal goals reviewed  Goals Unmet:  Not Applicable  Comments: Service time is from 10:30a to 12:00p    Dr. Rush Farmer is Medical Director for Pulmonary Rehab at [facility].

## 2018-02-12 ENCOUNTER — Encounter (HOSPITAL_COMMUNITY)
Admission: RE | Admit: 2018-02-12 | Discharge: 2018-02-12 | Disposition: A | Discharge status: Home / Self Care | Payer: 59 | Source: Ambulatory Visit | Attending: Pulmonary Disease | Admitting: Pulmonary Disease

## 2018-02-12 DIAGNOSIS — J849 Interstitial pulmonary disease, unspecified: Secondary | ICD-10-CM | POA: Diagnosis not present

## 2018-02-12 NOTE — Progress Notes (Signed)
ISTVAN BEHAR 22 y.o. male  DOB: 07-Aug-1995 MRN: [id number]           Nutrition Note 1. ILD (interstitial lung disease) (HCC)    No past medical history on file. Meds reviewed.     Current Outpatient Medications (Respiratory):  .  albuterol (PROVENTIL) (2.5 MG/3ML) 0.083% nebulizer solution, Take 3 mLs (2.5 mg total) by nebulization every 4 (four) hours as needed for wheezing or shortness of breath. .  budesonide-formoterol (SYMBICORT) 160-4.5 MCG/ACT inhaler, Inhale 2 puffs into the lungs 2 (two) times daily. .  budesonide-formoterol (SYMBICORT) 160-4.5 MCG/ACT inhaler, Inhale 2 puffs into the lungs every 12 (twelve) hours. .  Tiotropium Bromide Monohydrate (SPIRIVA RESPIMAT) 2.5 MCG/ACT AERS, Inhale 2 puffs into the lungs daily.    Current Outpatient Medications (Other):  Marland Kitchen  Respiratory Therapy Supplies (FLUTTER) DEVI, Use as directed   Ht: Ht Readings from Last 1 Encounters:  01/07/18 5\' 10"  (1.778 m)     Wt:  Wt Readings from Last 3 Encounters:  02/10/18 193 lb 9 oz (87.8 kg)  01/27/18 195 lb 1.7 oz (88.5 kg)  01/07/18 195 lb 12.3 oz (88.8 kg)     BMI: Body mass index is 27.77 kg/m.    Current tobacco use? No    Labs:  Lipid Panel  No results found for: CHOL, TRIG, HDL, CHOLHDL, VLDL, LDLCALC, LDLDIRECT  No results found for: HGBA1C  Nutrition Diagnosis  ? Food-and nutrition-related knowledge deficit related to lack of exposure to information as related to diagnosis of pulmonary disease ? Overweight related to excessive energy intake as evidenced by a BMI of Body mass index is 27.77 kg/m.   Goal(s)  1. Pt to identify and limit food sources of sodium. 2. The pt will have family and friends shop for food when necessary so that nourishing foods are always available at home. 3. Identify food quantities necessary to achieve wt loss of  -2# per week to a goal wt loss of 2.7-10.9 kg (6-24 lb) at graduation from pulmonary rehab. 4. Describe the benefit of  including fruits, vegetables, whole grains, and low-fat dairy products in a healthy meal plan.  Plan:  Pt to attend Pulmonary Nutrition class Will provide client-centered nutrition education as part of interdisciplinary care.    Monitor and Evaluate progress toward nutrition goal with team.   Ross Marcus, MS, RD, LDN 02/12/2018 2:12 PM

## 2018-02-12 NOTE — Progress Notes (Signed)
Daily Session Note  Patient Details  Name: Timothy Andrews MRN: [id number] Date of Birth: [birth year] Referring Provider:     Pulmonary Rehab Walk Test from 01/08/2018 in [place]  Referring Provider  Dr. Nelda Marseille      Encounter Date: 02/12/2018  Check In: Session Check In - 02/12/18 1047      Check-In   Supervising physician immediately available to respond to emergencies  [facility] immediately available    Physician(s)  Dr. Algis Liming    Location  [facility]    Staff Present  Su Hilt, MS, ACSM RCEP, Exercise Physiologist;Annedrea Rosezella Florida, RN, MHA;Lisa Ysidro Evert, RN;Dalton Kris Mouton, MS, Exercise Physiologist;Carlette Wilber Oliphant, RN, BSN    Medication changes reported      No    Fall or balance concerns reported     No    Tobacco Cessation  No Change    Warm-up and Cool-down  Performed as group-led instruction    Resistance Training Performed  Yes    VAD Patient?  No    PAD/SET Patient?  No      Pain Assessment   Currently in Pain?  No/denies    Multiple Pain Sites  No       Capillary Blood Glucose: No results found for this or any previous visit (from the past 24 hour(s)).    Social History   Tobacco Use  Smoking Status Former Smoker  . Packs/day: 0.25  . Years: 3.00  . Pack years: 0.75  . Types: Cigars  . Last attempt to quit: 07/13/2017  . Years since quitting: 0.5  Smokeless Tobacco Never Used    Goals Met:  Proper associated with RPD/PD & O2 Sat Exercise tolerated well  Goals Unmet:  Not Applicable  Comments: Service time is from 1030 to 1200    Dr. Rush Farmer is Medical Director for Pulmonary Rehab at [facility].

## 2018-02-17 ENCOUNTER — Encounter (HOSPITAL_COMMUNITY)
Admission: RE | Admit: 2018-02-17 | Discharge: 2018-02-17 | Disposition: A | Discharge status: Home / Self Care | Payer: 59 | Source: Ambulatory Visit | Attending: Pulmonary Disease | Admitting: Pulmonary Disease

## 2018-02-17 DIAGNOSIS — Z87891 Personal history of nicotine dependence: Secondary | ICD-10-CM | POA: Diagnosis not present

## 2018-02-17 DIAGNOSIS — J849 Interstitial pulmonary disease, unspecified: Secondary | ICD-10-CM | POA: Diagnosis present

## 2018-02-17 DIAGNOSIS — Z79899 Other long term (current) drug therapy: Secondary | ICD-10-CM | POA: Insufficient documentation

## 2018-02-17 DIAGNOSIS — Z7951 Long term (current) use of inhaled steroids: Secondary | ICD-10-CM | POA: Diagnosis not present

## 2018-02-17 NOTE — Progress Notes (Signed)
Daily Session Note  Patient Details  Name: Timothy Andrews MRN: [id number] Date of Birth: 23-Jul-1995 Referring Provider:     Pulmonary Rehab Walk Test from 01/08/2018 in [place]  Referring Provider  Dr. Nelda Marseille      Encounter Date: 02/17/2018  Check In:   Capillary Blood Glucose: No results found for this or any previous visit (from the past 24 hour(s)).    Social History   Tobacco Use  Smoking Status Former Smoker  . Packs/day: 0.25  . Years: 3.00  . Pack years: 0.75  . Types: Cigars  . Last attempt to quit: 07/13/2017  . Years since quitting: 0.6  Smokeless Tobacco Never Used    Goals Met:  Proper associated with RPD/PD & O2 Sat Exercise tolerated well  Goals Unmet:  Not Applicable  Comments: Service time is from 1030 to 1210    Dr. Rush Farmer is Medical Director for Pulmonary Rehab at [facility].

## 2018-02-19 ENCOUNTER — Encounter (HOSPITAL_COMMUNITY)
Admission: RE | Admit: 2018-02-19 | Discharge: 2018-02-19 | Disposition: A | Discharge status: Home / Self Care | Payer: 59 | Source: Ambulatory Visit | Attending: Pulmonary Disease | Admitting: Pulmonary Disease

## 2018-02-19 DIAGNOSIS — J849 Interstitial pulmonary disease, unspecified: Secondary | ICD-10-CM | POA: Diagnosis not present

## 2018-02-19 NOTE — Progress Notes (Signed)
Daily Session Note  Patient Details  Name: Timothy Andrews MRN: [id number] Date of Birth: 1995/11/06 Referring Provider:     Pulmonary Rehab Walk Test from 01/08/2018 in [place]  Referring Provider  Dr. Nelda Marseille      Encounter Date: 02/19/2018  Check In: Session Check In - 02/19/18 1030      Check-In   Supervising physician immediately available to respond to emergencies  [facility] immediately available    Physician(s)  Dr. Starla Link    Location  [facility]    Staff Present  Rosebud Poles, RN, BSN;Carlette Wilber Oliphant, RN, Bjorn Loser, MS, Exercise Physiologist;Annedrea Rosezella [place], RN, MHA    Medication changes reported      No    Fall or balance concerns reported     No    Tobacco Cessation  No Change    Warm-up and Cool-down  Performed as group-led instruction    Resistance Training Performed  Yes    VAD Patient?  No    PAD/SET Patient?  No      Pain Assessment   Currently in Pain?  No/denies    Multiple Pain Sites  No       Capillary Blood Glucose: No results found for this or any previous visit (from the past 24 hour(s)).    Social History   Tobacco Use  Smoking Status Former Smoker  . Packs/day: 0.25  . Years: 3.00  . Pack years: 0.75  . Types: Cigars  . Last attempt to quit: 07/13/2017  . Years since quitting: 0.6  Smokeless Tobacco Never Used    Goals Met:  Proper associated with RPD/PD & O2 Sat Exercise tolerated well  Goals Unmet:  Not Applicable  Comments: Service time is from 1030 to 1205    Dr. Rush Farmer is Medical Director for Pulmonary Rehab at [facility].

## 2018-02-24 ENCOUNTER — Encounter (HOSPITAL_COMMUNITY)
Admission: RE | Admit: 2018-02-24 | Discharge: 2018-02-24 | Disposition: A | Discharge status: Home / Self Care | Payer: 59 | Source: Ambulatory Visit | Attending: Pulmonary Disease | Admitting: Pulmonary Disease

## 2018-02-24 DIAGNOSIS — J849 Interstitial pulmonary disease, unspecified: Secondary | ICD-10-CM | POA: Diagnosis not present

## 2018-02-24 NOTE — Progress Notes (Signed)
Daily Session Note  Patient Details  Name: MEHDI GIRONDA MRN: [id number] Date of Birth: [birth year] Referring Provider:     Pulmonary Rehab Walk Test from 01/08/2018 in [place]  Referring Provider  Dr. Nelda Marseille      Encounter Date: 02/24/2018  Check In: Session Check In - 02/24/18 1030      Check-In   Supervising physician immediately available to respond to emergencies  [facility] immediately available    Physician(s)  Dr. Starla Link    Location  [facility]    Staff Present  Rosebud Poles, RN, BSN;Carlette Wilber Oliphant, RN, BSN;Lisa Ysidro Evert, Felipe Drone, RN, MHA    Medication changes reported      No    Tobacco Cessation  No Change    Warm-up and Cool-down  Performed as group-led instruction    Resistance Training Performed  Yes    VAD Patient?  No    PAD/SET Patient?  No      Pain Assessment   Currently in Pain?  No/denies    Multiple Pain Sites  No       Capillary Blood Glucose: No results found for this or any previous visit (from the past 24 hour(s)).  Exercise Prescription Changes - 02/24/18 1200      Response to Exercise   Blood Pressure (Admit)  106/62    Blood Pressure (Exercise)  150/76    Blood Pressure (Exit)  110/60    Heart Rate (Admit)  70 bpm    Heart Rate (Exercise)  109 bpm    Heart Rate (Exit)  98 bpm    Oxygen Saturation (Admit)  94 %    Oxygen Saturation (Exercise)  92 %    Oxygen Saturation (Exit)  94 %    Rating of Perceived Exertion (Exercise)  12    Perceived Dyspnea (Exercise)  2    Duration  Progress to 45 minutes of aerobic exercise without signs/symptoms of physical distress    Intensity  THRR unchanged      Resistance Training   Training Prescription  Yes    Weight  blue bands    Reps  10-15    Time  10 Minutes      Interval Training   Interval Training  No      Treadmill   MPH  2.5    Grade  3    Minutes  17      Bike   Level  1.3    Minutes  17      Rower    Level  4    Watts  40    Minutes  17       Social History   Tobacco Use  Smoking Status Former Smoker  . Packs/day: 0.25  . Years: 3.00  . Pack years: 0.75  . Types: Cigars  . Last attempt to quit: 07/13/2017  . Years since quitting: 0.6  Smokeless Tobacco Never Used    Goals Met:  Proper associated with RPD/PD & O2 Sat Exercise tolerated well  Goals Unmet:  Not Applicable  Comments: Service time is from 1030 to 1200    Dr. Rush Farmer is Medical Director for Pulmonary Rehab at [facility].

## 2018-02-25 NOTE — Progress Notes (Signed)
Pulmonary Individual Treatment Plan  Patient Details  Name: Timothy Andrews MRN: [id number] Date of Birth: 1995-11-12 Referring Provider:     Pulmonary Rehab Walk Test from 01/08/2018 in [facility] CARDIAC [facility]  Referring Provider  Dr. Molli Knock      Initial Encounter Date:    Pulmonary Rehab Walk Test from 01/08/2018 in [facility] CARDIAC REHAB  Date  01/08/18      Visit Diagnosis: ILD (interstitial lung disease) (HCC)  Patient's Home Medications on Admission:   Current Outpatient Medications:  .  albuterol (PROVENTIL) (2.5 MG/3ML) 0.083% nebulizer solution, Take 3 mLs (2.5 mg total) by nebulization every 4 (four) hours as needed for wheezing or shortness of breath., Disp: 75 mL, Rfl: 12 .  budesonide-formoterol (SYMBICORT) 160-4.5 MCG/ACT inhaler, Inhale 2 puffs into the lungs 2 (two) times daily., Disp: 1 Inhaler, Rfl: 11 .  budesonide-formoterol (SYMBICORT) 160-4.5 MCG/ACT inhaler, Inhale 2 puffs into the lungs every 12 (twelve) hours., Disp: 1 Inhaler, Rfl: 6 .  Respiratory Therapy Supplies (FLUTTER) DEVI, Use as directed, Disp: 1 each, Rfl: 0 .  Tiotropium Bromide Monohydrate (SPIRIVA RESPIMAT) 2.5 MCG/ACT AERS, Inhale 2 puffs into the lungs daily., Disp: 1 Inhaler, Rfl: 5  Past Medical History: No past medical history on file.  Tobacco Use: Social History   Tobacco Use  Smoking Status Former Smoker  . Packs/day: 0.25  . Years: 3.00  . Pack years: 0.75  . Types: Cigars  . Last attempt to quit: 07/13/2017  . Years since quitting: 0.6  Smokeless Tobacco Never Used    Labs: Recent Review Flowsheet Data    Labs for ITP Cardiac and Pulmonary Rehab Latest Ref Rng & Units 07/13/2017 07/13/2017 07/13/2017 07/13/2017   PHART 7.350 - 7.450 7.219(L) 7.247(L) - -   PCO2ART 32.0 - 48.0 mmHg 51.2(H) 51.9(H) - -   HCO3 20.0 - 28.0 mmol/L 20.1 22.6 19.4(L) -   TCO2 22 - 32 mmol/L - 24 21(L) -   ACIDBASEDEF 0.0 - 2.0 mmol/L 6.3(H) 5.0(H) 9.0(H) -     O2SAT % 70.9 99.0 27.0 70.9      Capillary Blood Glucose: Lab Results  Component Value Date   GLUCAP 212 (H) 07/13/2017     Pulmonary Assessment Scores: Pulmonary Assessment Scores    Row Name 01/09/18 1005 01/14/18 1452       ADL UCSD   ADL Phase  Entry  Entry    SOB Score total  -  68      CAT Score   CAT Score  -  19      mMRC Score   mMRC Score  2  -       Pulmonary Function Assessment:   Exercise Target Goals: Exercise Program Goal: Individual exercise prescription set using results from initial 6 min walk test and THRR while considering  patient's activity barriers and safety.   Exercise Prescription Goal: Initial exercise prescription builds to 30-45 minutes a day of aerobic activity, 2-3 days per week.  Home exercise guidelines will be given to patient during program as part of exercise prescription that the participant will acknowledge.  Activity Barriers & Risk Stratification: Activity Barriers & Cardiac Risk Stratification - 01/07/18 1036      Activity Barriers & Cardiac Risk Stratification   Activity Barriers  Shortness of Breath;Back Problems   l knee hyperextended February 10th      6 Minute Walk: 6 Minute Walk    Row Name 01/09/18 1004  6 Minute Walk   Phase  Initial     Distance  1275 feet     Walk Time  6 minutes     # of Rest Breaks  0     MPH  2.41     METS  2.84     RPE  12     Perceived Dyspnea   1     Symptoms  No     Resting HR  73 bpm     Resting BP  134/84     Resting Oxygen Saturation   96 %     Exercise Oxygen Saturation  during 6 min walk  88 %     Max Ex. HR  77 bpm     Max Ex. BP  140/80       Interval HR   1 Minute HR  76     2 Minute HR  77     3 Minute HR  77     4 Minute HR  75     5 Minute HR  75     6 Minute HR  75     2 Minute Post HR  72     Interval Heart Rate?  Yes       Interval Oxygen   Interval Oxygen?  Yes     Baseline Oxygen Saturation %  96 %     1 Minute Oxygen Saturation %  94  %     1 Minute Liters of Oxygen  0 L     2 Minute Oxygen Saturation %  92 %     2 Minute Liters of Oxygen  0 L     3 Minute Oxygen Saturation %  89 %     3 Minute Liters of Oxygen  0 L     4 Minute Oxygen Saturation %  88 %     4 Minute Liters of Oxygen  0 L     5 Minute Oxygen Saturation %  90 %     5 Minute Liters of Oxygen  0 L     6 Minute Oxygen Saturation %  88 %     6 Minute Liters of Oxygen  0 L     2 Minute Post Oxygen Saturation %  96 %     2 Minute Post Liters of Oxygen  0 L        Oxygen Initial Assessment: Oxygen Initial Assessment - 01/09/18 1005      Initial 6 min Walk   Oxygen Used  None      Program Oxygen Prescription   Program Oxygen Prescription  None       Oxygen Re-Evaluation: Oxygen Re-Evaluation    Row Name 01/27/18 1018 02/25/18 0814           Program Oxygen Prescription   Program Oxygen Prescription  None  None        Home Oxygen   Home Oxygen Device  None  None      Sleep Oxygen Prescription  None  None      Home Exercise Oxygen Prescription  None  None      Home at Rest Exercise Oxygen Prescription  None  None      Compliance with Home Oxygen Use  No  No        Goals/Expected Outcomes   Short Term Goals  -  To learn and understand importance of maintaining oxygen saturations>88%;To learn and demonstrate proper use of  respiratory medications;To learn and understand importance of monitoring SPO2 with pulse oximeter and demonstrate accurate use of the pulse oximeter.;To learn and demonstrate proper pursed lip breathing techniques or other breathing techniques.;To learn and exhibit compliance with exercise, home and travel O2 prescription      Long  Term Goals  -  Verbalizes importance of monitoring SPO2 with pulse oximeter and return demonstration;Exhibits compliance with exercise, home and travel O2 prescription;Maintenance of O2 saturations>88%;Exhibits proper breathing techniques, such as pursed lip breathing or other method taught during  program session;Compliance with respiratory medication;Demonstrates proper use of MDI's         Oxygen Discharge (Final Oxygen Re-Evaluation): Oxygen Re-Evaluation - 02/25/18 0814      Program Oxygen Prescription   Program Oxygen Prescription  None      Home Oxygen   Home Oxygen Device  None    Sleep Oxygen Prescription  None    Home Exercise Oxygen Prescription  None    Home at Rest Exercise Oxygen Prescription  None    Compliance with Home Oxygen Use  No      Goals/Expected Outcomes   Short Term Goals  To learn and understand importance of maintaining oxygen saturations>88%;To learn and demonstrate proper use of respiratory medications;To learn and understand importance of monitoring SPO2 with pulse oximeter and demonstrate accurate use of the pulse oximeter.;To learn and demonstrate proper pursed lip breathing techniques or other breathing techniques.;To learn and exhibit compliance with exercise, home and travel O2 prescription    Long  Term Goals  Verbalizes importance of monitoring SPO2 with pulse oximeter and return demonstration;Exhibits compliance with exercise, home and travel O2 prescription;Maintenance of O2 saturations>88%;Exhibits proper breathing techniques, such as pursed lip breathing or other method taught during program session;Compliance with respiratory medication;Demonstrates proper use of MDI's       Initial Exercise Prescription: Initial Exercise Prescription - 01/09/18 1000      Date of Initial Exercise RX and Referring Provider   Date  01/08/18    Referring Provider  Dr. Molli Knock      Treadmill   MPH  2.5    Grade  3    Minutes  17      Bike   Level  0.8    Minutes  17      Rower   Level  2    Watts  40    Minutes  17      Prescription Details   Frequency (times per week)  2    Duration  Progress to 45 minutes of aerobic exercise without signs/symptoms of physical distress      Intensity   THRR 40-80% of Max Heartrate  79-158    Ratings of  Perceived Exertion  11-13    Perceived Dyspnea  0-4      Progression   Progression  Continue progressive overload as per policy without signs/symptoms or physical distress.      Resistance Training   Training Prescription  Yes    Weight  blue bands    Reps  10-15       Perform Capillary Blood Glucose checks as needed.  Exercise Prescription Changes:  Exercise Prescription Changes    Row Name 01/27/18 1200 02/10/18 1200 02/24/18 1200         Response to Exercise   Blood Pressure (Admit)  143/88  138/86  106/62     Blood Pressure (Exercise)  158/72  160/86  150/76     Blood Pressure (Exit)  130/90  112/90  110/60     Heart Rate (Admit)  60 bpm  78 bpm  70 bpm     Heart Rate (Exercise)  114 bpm  105 bpm  109 bpm     Heart Rate (Exit)  75 bpm  77 bpm  98 bpm     Oxygen Saturation (Admit)  95 %  95 %  94 %     Oxygen Saturation (Exercise)  91 %  92 %  92 %     Oxygen Saturation (Exit)  95 %  96 %  94 %     Rating of Perceived Exertion (Exercise)  13  14  12      Perceived Dyspnea (Exercise)  2  2  2      Duration  Progress to 45 minutes of aerobic exercise without signs/symptoms of physical distress  Progress to 45 minutes of aerobic exercise without signs/symptoms of physical distress  Progress to 45 minutes of aerobic exercise without signs/symptoms of physical distress     Intensity  THRR unchanged  THRR unchanged  THRR unchanged       Resistance Training   Training Prescription  Yes  Yes  Yes     Weight  blue bands  blue bands  blue bands     Reps  10-15  10-15  10-15     Time  -  -  10 Minutes       Interval Training   Interval Training  -  -  No       Treadmill   MPH  2.2  2.5  2.5     Grade  2  3  3      Minutes  17  17  17        Bike   Level  0.8  1.2  1.3     Minutes  17  17  17        Rower   Level  2  4  4      Watts  40  40  40     Minutes  17  17  17         Exercise Comments:  Exercise Comments    Row Name 01/29/18 0828           Exercise  Comments  Home exercise completed          Exercise Goals and Review:  Exercise Goals    Row Name 01/07/18 1018             Exercise Goals   Increase Physical Activity  Yes       Intervention  Provide advice, education, support and counseling about physical activity/exercise needs.;Develop an individualized exercise prescription for aerobic and resistive training based on initial evaluation findings, risk stratification, comorbidities and participant's personal goals.       Expected Outcomes  Short Term: Attend rehab on a regular basis to increase amount of physical activity.;Long Term: Exercising regularly at least 3-5 days a week.;Long Term: Add in home exercise to make exercise part of routine and to increase amount of physical activity.       Increase Strength and Stamina  Yes       Intervention  Provide advice, education, support and counseling about physical activity/exercise needs.;Develop an individualized exercise prescription for aerobic and resistive training based on initial evaluation findings, risk stratification, comorbidities and participant's personal goals.       Expected Outcomes  Short Term: Increase workloads from initial exercise prescription for resistance,  speed, and METs.;Short Term: Perform resistance training exercises routinely during rehab and add in resistance training at home;Long Term: Improve cardiorespiratory fitness, muscular endurance and strength as measured by increased METs and functional capacity (6MWT)       Able to understand and use rate of perceived exertion (RPE) scale  Yes       Intervention  Provide education and explanation on how to use RPE scale       Expected Outcomes  Short Term: Able to use RPE daily in rehab to express subjective intensity level;Long Term:  Able to use RPE to guide intensity level when exercising independently       Able to understand and use Dyspnea scale  Yes       Intervention  Provide education and explanation on how  to use Dyspnea scale       Expected Outcomes  Short Term: Able to use Dyspnea scale daily in rehab to express subjective sense of shortness of breath during exertion;Long Term: Able to use Dyspnea scale to guide intensity level when exercising independently       Knowledge and understanding of Target Heart Rate Range (THRR)  Yes       Intervention  Provide education and explanation of THRR including how the numbers were predicted and where they are located for reference       Expected Outcomes  Short Term: Able to state/look up THRR;Long Term: Able to use THRR to govern intensity when exercising independently;Short Term: Able to use daily as guideline for intensity in rehab       Understanding of Exercise Prescription  Yes       Intervention  Provide education, explanation, and written materials on patient's individual exercise prescription       Expected Outcomes  Short Term: Able to explain program exercise prescription;Long Term: Able to explain home exercise prescription to exercise independently          Exercise Goals Re-Evaluation : Exercise Goals Re-Evaluation    Row Name 01/27/18 1018 02/25/18 0814           Exercise Goal Re-Evaluation   Exercise Goals Review  Increase Physical Activity;Increase Strength and Stamina;Able to understand and use rate of perceived exertion (RPE) scale;Able to understand and use Dyspnea scale;Knowledge and understanding of Target Heart Rate Range (THRR);Understanding of Exercise Prescription  Increase Physical Activity;Increase Strength and Stamina;Able to understand and use rate of perceived exertion (RPE) scale;Able to understand and use Dyspnea scale;Knowledge and understanding of Target Heart Rate Range (THRR);Understanding of Exercise Prescription      Comments  Patient has only attended 3 rehab sessions. Patient is very gaurded and hesitant to workload changes. Takes numerous breaks during exercise. Will cont. to monitor and motivate as able.  Pt is  gaurded and takes frequent breaks during exercise. Have went over breathing techniques with pt several times. Will continue to monitor and progress as able.       Expected Outcomes  Through exercise at rehab and at home, patient will increase physicial capacity and ADL's will be easier to perform at home. Patient will also develop exercise regime at home.  Through exercise at rehab and at home, patient will increase physicial capacity and ADL's will be easier to perform at home. Patient will also develop exercise regime at home.         Discharge Exercise Prescription (Final Exercise Prescription Changes): Exercise Prescription Changes - 02/24/18 1200      Response to Exercise   Blood Pressure (  Admit)  106/62    Blood Pressure (Exercise)  150/76    Blood Pressure (Exit)  110/60    Heart Rate (Admit)  70 bpm    Heart Rate (Exercise)  109 bpm    Heart Rate (Exit)  98 bpm    Oxygen Saturation (Admit)  94 %    Oxygen Saturation (Exercise)  92 %    Oxygen Saturation (Exit)  94 %    Rating of Perceived Exertion (Exercise)  12    Perceived Dyspnea (Exercise)  2    Duration  Progress to 45 minutes of aerobic exercise without signs/symptoms of physical distress    Intensity  THRR unchanged      Resistance Training   Training Prescription  Yes    Weight  blue bands    Reps  10-15    Time  10 Minutes      Interval Training   Interval Training  No      Treadmill   MPH  2.5    Grade  3    Minutes  17      Bike   Level  1.3    Minutes  17      Rower   Level  4    Watts  40    Minutes  17       Nutrition:  Target Goals: Understanding of nutrition guidelines, daily intake of sodium 1500mg , cholesterol 200mg , calories 30% from fat and 7% or less from saturated fats, daily to have 5 or more servings of fruits and vegetables.  Biometrics:    Nutrition Therapy Plan and Nutrition Goals:   Nutrition Assessments:   Nutrition Goals Re-Evaluation:   Nutrition Goals Discharge  (Final Nutrition Goals Re-Evaluation):   Psychosocial: Target Goals: Acknowledge presence or absence of significant depression and/or stress, maximize coping skills, provide positive support system. Participant is able to verbalize types and ability to use techniques and skills needed for reducing stress and depression.  Initial Review & Psychosocial Screening: Initial Psych Review & Screening - 01/07/18 1518      Initial Review   Comments  pt has had to move back home with his mom and this has caused some intra familial strain, pt shortness of breath keeps him from being able to do ADL and work.      Family Dynamics   Comments  family has been supportive during his recovery      Barriers   Psychosocial barriers to participate in program  The patient should benefit from training in stress management and relaxation.       Quality of Life Scores:  Scores of 19 and below usually indicate a poorer quality of life in these areas.  A difference of  2-3 points is a clinically meaningful difference.  A difference of 2-3 points in the total score of the Quality of Life Index has been associated with significant improvement in overall quality of life, self-image, physical symptoms, and general health in studies assessing change in quality of life.  PHQ-9: Recent Review Flowsheet Data    Depression screen [facility] 2/9 01/07/2018 01/07/2018 01/17/2017 01/09/2017   Decreased Interest - 0 0 0   Down, Depressed, Hopeless - 2 0 0   PHQ - 2 Score - 2 0 0   Altered sleeping - 0 - -   Tired, decreased energy - 0 - -   Change in appetite - 0 - -   Feeling bad or failure about yourself  - 3 - -  Trouble concentrating - 0 - -   Moving slowly or fidgety/restless - 0 - -   Suicidal thoughts (No Data)  1 - -   PHQ-9 Score - 6 - -   Difficult doing work/chores - Very difficult - -     Interpretation of Total Score  Total Score Depression Severity:  1-4 = Minimal depression, 5-9 = Mild depression, 10-14 =  Moderate depression, 15-19 = Moderately severe depression, 20-27 = Severe depression   Psychosocial Evaluation and Intervention: Psychosocial Evaluation - 02/25/18 1019      Psychosocial Evaluation & Interventions   Interventions  Stress management education;Relaxation education;Encouraged to exercise with the program and follow exercise prescription    Comments  Pt feels stressed regarding his disease process and his living situation.  Pt has started a new job and is back on at [employer].  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  This suits Aidric need for less stress.  Pt is saving his money which will hopefully give him some financial stability and further reduce his stress.    Expected Outcomes  Pt will be able to demonstrate positive and healthy coping skill to deal with life stressors.    Continue Psychosocial Services   Follow up required by staff       Psychosocial Re-Evaluation: Psychosocial Re-Evaluation    Row Name 01/28/18 1720 02/25/18 1020           Psychosocial Re-Evaluation   Current issues with  Current Depression;Current Stress Concerns  Current Depression;Current Stress Concerns      Comments  Pt is doing well in exercise at pulmonary rehab with the completion of 4 sessions.  Encourage exercise and attendance to educational classes.  Pt can become disengaged with the class, I beleive because of the huge age difference.  Pt will however engage with staff.  P      Expected Outcomes  Pt will demonstrate positive and healthy coping skills.  Pt will utilize community resources such as vocational rehab to regain his independence.  Pt will demonstrate positive and healthy coping skills.  Pt will utilize community resources such as vocational rehab to regain his independence.      Interventions  Relaxation education;Encouraged to attend Pulmonary Rehabilitation for the exercise;Stress management education  Relaxation education;Encouraged to attend Pulmonary Rehabilitation for the  exercise;Stress management education      Continue Psychosocial Services   Follow up required by staff  Follow up required by staff      Comments  pt has had to move back home with his mom and this has caused some intra familial strain, pt shortness of breath keeps him from being able to do ADL and work.  pt has had to move back home with his mom and this has caused some intra familial strain, pt shortness of breath keeps him from being able to do ADL and work.        Initial Review   Source of Stress Concerns  Unable to perform yard/household activities;Financial;Family;Occupation;Unable to participate in former interests or hobbies  Unable to perform yard/household activities;Financial;Family;Occupation;Unable to participate in former interests or hobbies         Psychosocial Discharge (Final Psychosocial Re-Evaluation): Psychosocial Re-Evaluation - 02/25/18 1020      Psychosocial Re-Evaluation   Current issues with  Current Depression;Current Stress Concerns    Comments  Pt can become disengaged with the class, I beleive because of the huge age difference.  Pt will however engage with staff.  P  Expected Outcomes  Pt will demonstrate positive and healthy coping skills.  Pt will utilize community resources such as vocational rehab to regain his independence.    Interventions  Relaxation education;Encouraged to attend Pulmonary Rehabilitation for the exercise;Stress management education    Continue Psychosocial Services   Follow up required by staff    Comments  pt has had to move back home with his mom and this has caused some intra familial strain, pt shortness of breath keeps him from being able to do ADL and work.      Initial Review   Source of Stress Concerns  Unable to perform yard/household activities;Financial;Family;Occupation;Unable to participate in former interests or hobbies       Education: Education Goals: Education classes will be provided on a weekly basis, covering  required topics. Participant will state understanding/return demonstration of topics presented.  Learning Barriers/Preferences: Learning Barriers/Preferences - 01/07/18 1035      Learning Barriers/Preferences   Learning Barriers  None    Learning Preferences  Skilled Demonstration;Group Instruction;Video;Written Material       Education Topics: Risk Factor Reduction:  -Group instruction that is supported by a PowerPoint presentation. Instructor discusses the definition of a risk factor, different risk factors for pulmonary disease, and how the heart and lungs work together.     Nutrition for Pulmonary Patient:  -Group instruction provided by PowerPoint slides, verbal discussion, and written materials to support subject matter. The instructor gives an explanation and review of healthy diet recommendations, which includes a discussion on weight management, recommendations for fruit and vegetable consumption, as well as protein, fluid, caffeine, fiber, sodium, sugar, and alcohol. Tips for eating when patients are short of breath are discussed.   Pursed Lip Breathing:  -Group instruction that is supported by demonstration and informational handouts. Instructor discusses the benefits of pursed lip and diaphragmatic breathing and detailed demonstration on how to preform both.     Oxygen Safety:  -Group instruction provided by PowerPoint, verbal discussion, and written material to support subject matter. There is an overview of "What is Oxygen" and "Why do we need it".  Instructor also reviews how to create a safe environment for oxygen use, the importance of using oxygen as prescribed, and the risks of noncompliance. There is a brief discussion on traveling with oxygen and resources the patient may utilize.   PULMONARY REHAB OTHER RESPIRATORY from 02/19/2018 in [facility] CARDIAC REHAB  Date  01/15/18  Educator  molly  Instruction Review Code  1- Verbalizes Understanding       Oxygen Equipment:  -Group instruction provided by [employer], written materials, and Chief Technology Officer.   PULMONARY REHAB OTHER RESPIRATORY from 02/19/2018 in [facility] CARDIAC REHAB  Date  01/22/18  Educator  [employer]  Instruction Review Code  1- Verbalizes Understanding      Signs and Symptoms:  -Group instruction provided by written material and verbal discussion to support subject matter. Warning signs and symptoms of infection, stroke, and heart attack are reviewed and when to call the physician/911 reinforced. Tips for preventing the spread of infection discussed.   Advanced Directives:  -Group instruction provided by verbal instruction and written material to support subject matter. Instructor reviews Advanced Directive laws and proper instruction for filling out document.   Pulmonary Video:  -Group video education that reviews the importance of medication and oxygen compliance, exercise, good nutrition, pulmonary hygiene, and pursed lip and diaphragmatic breathing for the pulmonary patient.   Exercise for  the Pulmonary Patient:  -Group instruction that is supported by a PowerPoint presentation. Instructor discusses benefits of exercise, core components of exercise, frequency, duration, and intensity of an exercise routine, importance of utilizing pulse oximetry during exercise, safety while exercising, and options of places to exercise outside of rehab.     PULMONARY REHAB OTHER RESPIRATORY from 02/19/2018 in [facility] CARDIAC REHAB  Date  01/29/18  Educator  EP  Instruction Review Code  1- Verbalizes Understanding      Pulmonary Medications:  -Verbally interactive group education provided by instructor with focus on inhaled medications and proper administration.   Anatomy and Physiology of the Respiratory System and Intimacy:  -Group instruction provided by PowerPoint, verbal discussion, and written  material to support subject matter. Instructor reviews respiratory cycle and anatomical components of the respiratory system and their functions. Instructor also reviews differences in obstructive and restrictive respiratory diseases with examples of each. Intimacy, Sex, and Sexuality differences are reviewed with a discussion on how relationships can change when diagnosed with pulmonary disease. Common sexual concerns are reviewed.   PULMONARY REHAB OTHER RESPIRATORY from 02/19/2018 in [facility] CARDIAC REHAB  Date  02/19/18  Educator  RN  Instruction Review Code  1- Verbalizes Understanding      MD DAY -A group question and answer session with a medical doctor that allows participants to ask questions that relate to their pulmonary disease state.   PULMONARY REHAB OTHER RESPIRATORY from 02/19/2018 in [facility] CARDIAC REHAB  Date  02/05/18  Educator  yacoub  Instruction Review Code  1- Verbalizes Understanding      OTHER EDUCATION -Group or individual verbal, written, or video instructions that support the educational goals of the pulmonary rehab program.   Holiday Eating Survival Tips:  -Group instruction provided by PowerPoint slides, verbal discussion, and written materials to support subject matter. The instructor gives patients tips, tricks, and techniques to help them not only survive but enjoy the holidays despite the onslaught of food that accompanies the holidays.   Knowledge Questionnaire Score: Knowledge Questionnaire Score - 01/14/18 1452      Knowledge Questionnaire Score   Pre Score  15/18       Core Components/Risk Factors/Patient Goals at Admission: Personal Goals and Risk Factors at Admission - 01/07/18 1138      Core Components/Risk Factors/Patient Goals on Admission    Weight Management  Yes;Weight Maintenance    Intervention  Weight Management: Develop a combined nutrition and exercise program designed to reach desired  caloric intake, while maintaining appropriate intake of nutrient and fiber, sodium and fats, and appropriate energy expenditure required for the weight goal.;Weight Management: Provide education and appropriate resources to help participant work on and attain dietary goals.;Weight Management/Obesity: Establish reasonable short term and long term weight goals.    Admit Weight  195 lb 12.3 oz (88.8 kg)    Expected Outcomes  Weight Maintenance: Understanding of the daily nutrition guidelines, which includes 25-35% calories from fat, 7% or less cal from saturated fats, less than 200mg  cholesterol, less than 1.5gm of sodium, & 5 or more servings of fruits and vegetables daily;Understanding recommendations for meals to include 15-35% energy as protein, 25-35% energy from fat, 35-60% energy from carbohydrates, less than 200mg  of dietary cholesterol, 20-35 gm of total fiber daily;Understanding of distribution of calorie intake throughout the day with the consumption of 4-5 meals/snacks;Short Term: Continue to assess and modify interventions until short term weight is achieved;Long Term: Adherence to  nutrition and physical activity/exercise program aimed toward attainment of established weight goal    Improve shortness of breath with ADL's  Yes    Intervention  Provide education, individualized exercise plan and daily activity instruction to help decrease symptoms of SOB with activities of daily living.    Expected Outcomes  Short Term: Improve cardiorespiratory fitness to achieve a reduction of symptoms when performing ADLs;Long Term: Be able to perform more ADLs without symptoms or delay the onset of symptoms       Core Components/Risk Factors/Patient Goals Review:  Goals and Risk Factor Review    Row Name 01/28/18 1722 02/25/18 1022           Core Components/Risk Factors/Patient Goals Review   Personal Goals Review  Weight Management/Obesity;Develop more efficient breathing techniques such as purse lipped  breathing and diaphragmatic breathing and practicing self-pacing with activity.;Improve shortness of breath with ADL's;Increase knowledge of respiratory medications and ability to use respiratory devices properly.  Weight Management/Obesity;Develop more efficient breathing techniques such as purse lipped breathing and diaphragmatic breathing and practicing self-pacing with activity.;Improve shortness of breath with ADL's;Increase knowledge of respiratory medications and ability to use respiratory devices properly.;Stress      Review  Pt has completed 4 exercise sessions and 2 education classes. Pt is doing well in rehab and this has been a tough transition for him due to his very young age in comparison to others.  Encourage pt to remain focused on his personal goals.  Pt weight remains unchanged from 10/3.  Pt has attended edcuation classes on oxygen and durable home equipment. Pt demonstrates good technique with PLB. Will hopefully move toward independence. workloads airydyne .8. rower level 2 and treadmill 2.2/2.0.  Continue to monitor pt progress during next 30 day review.  Pt has completed 12 exercise sessions and 5 education classes. Pt is doing well in rehab and this has been a tough transition for him due to his very young age in comparison to others.  Encourage pt to remain focused on his personal goals.  Pt weight rdecreased 3.5 kg.  Pt demonstrates good technique with PLB. Will hopefully move toward independence. workloads airydyne 1.3. rower increase to  level 4 and treadmill 2.5/3.0.  Continue to monitor pt progress during next 30 day review.      Expected Outcomes  See admission goals/outcomes  See admission goals/outcomes         Core Components/Risk Factors/Patient Goals at Discharge (Final Review):  Goals and Risk Factor Review - 02/25/18 1022      Core Components/Risk Factors/Patient Goals Review   Personal Goals Review  Weight Management/Obesity;Develop more efficient breathing  techniques such as purse lipped breathing and diaphragmatic breathing and practicing self-pacing with activity.;Improve shortness of breath with ADL's;Increase knowledge of respiratory medications and ability to use respiratory devices properly.;Stress    Review  Pt has completed 12 exercise sessions and 5 education classes. Pt is doing well in rehab and this has been a tough transition for him due to his very young age in comparison to others.  Encourage pt to remain focused on his personal goals.  Pt weight rdecreased 3.5 kg.  Pt demonstrates good technique with PLB. Will hopefully move toward independence. workloads airydyne 1.3. rower increase to  level 4 and treadmill 2.5/3.0.  Continue to monitor pt progress during next 30 day review.    Expected Outcomes  See admission goals/outcomes       ITP Comments: ITP Comments    Row Name 01/07/18 1004  01/28/18 1718 02/25/18 1019       ITP Comments  Dr. Charlestine Massed, Medical Director Pulmonary Rehab  Dr. Charlestine Massed, Medical Director Pulmonary Rehab  Dr. Charlestine Massed, Medical Director Pulmonary Rehab        Comments: Pt has completed 12 exercise session. Continue to monitor his progress during the next 30 day assessment. Alanson Aly, BSN Cardiac and Emergency planning/management officer

## 2018-02-26 ENCOUNTER — Encounter (HOSPITAL_COMMUNITY)
Admission: RE | Admit: 2018-02-26 | Discharge: 2018-02-26 | Disposition: A | Discharge status: Home / Self Care | Payer: 59 | Source: Ambulatory Visit | Attending: Pulmonary Disease | Admitting: Pulmonary Disease

## 2018-02-26 DIAGNOSIS — J849 Interstitial pulmonary disease, unspecified: Secondary | ICD-10-CM

## 2018-02-26 NOTE — Progress Notes (Signed)
Daily Session Note  Patient Details  Name: Timothy Andrews MRN: [id number] Date of Birth: June 29, 1995 Referring Provider:     Pulmonary Rehab Walk Test from 01/08/2018 in [place]  Referring Provider  Dr. Nelda Marseille      Encounter Date: 02/26/2018  Check In: Session Check In - 02/26/18 1235      Check-In   Supervising physician immediately available to respond to emergencies  [facility] immediately available    Physician(s)  Dr. Maryland Pink    Location  [facility]    Staff Present  Maurice Small, RN, Bjorn Loser, MS, Exercise Physiologist;Lisa Ysidro Evert, RN    Medication changes reported      No    Fall or balance concerns reported     No    Tobacco Cessation  No Change    Warm-up and Cool-down  Performed as group-led instruction    Resistance Training Performed  Yes    VAD Patient?  No    PAD/SET Patient?  No      Pain Assessment   Currently in Pain?  No/denies    Pain Score  0-No pain    Multiple Pain Sites  No       Capillary Blood Glucose: No results found for this or any previous visit (from the past 24 hour(s)).    Social History   Tobacco Use  Smoking Status Former Smoker  . Packs/day: 0.25  . Years: 3.00  . Pack years: 0.75  . Types: Cigars  . Last attempt to quit: 07/13/2017  . Years since quitting: 0.6  Smokeless Tobacco Never Used    Goals Met:  Proper associated with RPD/PD & O2 Sat Exercise tolerated well  Goals Unmet:  Not Applicable  Comments: Service time is from 1030 to 1205   Dr. Rush Farmer is Medical Director for Pulmonary Rehab at [facility].

## 2018-03-03 ENCOUNTER — Encounter (HOSPITAL_COMMUNITY)
Admission: RE | Admit: 2018-03-03 | Discharge: 2018-03-03 | Disposition: A | Discharge status: Home / Self Care | Payer: 59 | Source: Ambulatory Visit | Attending: Pulmonary Disease | Admitting: Pulmonary Disease

## 2018-03-03 DIAGNOSIS — J849 Interstitial pulmonary disease, unspecified: Secondary | ICD-10-CM

## 2018-03-03 NOTE — Progress Notes (Signed)
Daily Session Note  Patient Details  Name: Timothy Andrews MRN: [id number] Date of Birth: 08/25/1995 Referring Provider:     Pulmonary Rehab Walk Test from 01/08/2018 in [place]  Referring Provider  Dr. Nelda Marseille      Encounter Date: 03/03/2018  Check In: Session Check In - 03/03/18 1241      Check-In   Supervising physician immediately available to respond to emergencies  [facility] immediately available    Physician(s)  Dr. Maryland Pink    Location  [facility]    Staff Present  Maurice Small, RN, Bjorn Loser, MS, Exercise Physiologist;Lisa Ysidro Evert, RN;Olinty Celesta Aver, MS, ACSM CEP, Exercise Physiologist    Medication changes reported      No    Fall or balance concerns reported     No    Tobacco Cessation  No Change    Warm-up and Cool-down  Performed as group-led instruction    Resistance Training Performed  Yes    VAD Patient?  No    PAD/SET Patient?  No      Pain Assessment   Currently in Pain?  No/denies    Pain Score  0-No pain    Multiple Pain Sites  No       Capillary Blood Glucose: No results found for this or any previous visit (from the past 24 hour(s)).    Social History   Tobacco Use  Smoking Status Former Smoker  . Packs/day: 0.25  . Years: 3.00  . Pack years: 0.75  . Types: Cigars  . Last attempt to quit: 07/13/2017  . Years since quitting: 0.6  Smokeless Tobacco Never Used    Goals Met:  Proper associated with RPD/PD & O2 Sat Exercise tolerated well  Goals Unmet:  Not Applicable  Comments: Service time is from 1030 to 1210    Dr. Rush Farmer is Medical Director for Pulmonary Rehab at [facility].

## 2018-03-05 ENCOUNTER — Encounter (HOSPITAL_COMMUNITY)
Admission: RE | Admit: 2018-03-05 | Discharge: 2018-03-05 | Disposition: A | Discharge status: Home / Self Care | Payer: 59 | Source: Ambulatory Visit | Attending: Pulmonary Disease | Admitting: Pulmonary Disease

## 2018-03-05 DIAGNOSIS — J849 Interstitial pulmonary disease, unspecified: Secondary | ICD-10-CM | POA: Diagnosis not present

## 2018-03-05 NOTE — Progress Notes (Signed)
Daily Session Note  Patient Details  Name: Timothy Andrews MRN: [id number] Date of Birth: 1995/08/11 Referring Provider:     Pulmonary Rehab Walk Test from 01/08/2018 in [place]  Referring Provider  Dr. Nelda Marseille      Encounter Date: 03/05/2018  Check In:   Capillary Blood Glucose: No results found for this or any previous visit (from the past 24 hour(s)).    Social History   Tobacco Use  Smoking Status Former Smoker  . Packs/day: 0.25  . Years: 3.00  . Pack years: 0.75  . Types: Cigars  . Last attempt to quit: 07/13/2017  . Years since quitting: 0.6  Smokeless Tobacco Never Used    Goals Met:  Proper associated with RPD/PD & O2 Sat Exercise tolerated well  Goals Unmet:  Not Applicable  Comments: Service time is from 1030 to 1200    Dr. Rush Farmer is Medical Director for Pulmonary Rehab at [facility].

## 2018-03-10 ENCOUNTER — Encounter (HOSPITAL_COMMUNITY): Payer: 59

## 2018-03-17 ENCOUNTER — Encounter (HOSPITAL_COMMUNITY)
Admission: RE | Admit: 2018-03-17 | Discharge: 2018-03-17 | Disposition: A | Discharge status: Home / Self Care | Payer: 59 | Source: Ambulatory Visit | Attending: Pulmonary Disease | Admitting: Pulmonary Disease

## 2018-03-17 DIAGNOSIS — Z79899 Other long term (current) drug therapy: Secondary | ICD-10-CM | POA: Insufficient documentation

## 2018-03-17 DIAGNOSIS — Z7951 Long term (current) use of inhaled steroids: Secondary | ICD-10-CM | POA: Insufficient documentation

## 2018-03-17 DIAGNOSIS — J849 Interstitial pulmonary disease, unspecified: Secondary | ICD-10-CM

## 2018-03-17 DIAGNOSIS — Z87891 Personal history of nicotine dependence: Secondary | ICD-10-CM | POA: Diagnosis not present

## 2018-03-17 NOTE — Progress Notes (Signed)
Daily Session Note  Patient Details  Name: Timothy Andrews MRN: [id number] Date of Birth: 03-22-1996 Referring Provider:     Pulmonary Rehab Walk Test from 01/08/2018 in [place]  Referring Provider  Dr. Nelda Marseille      Encounter Date: 03/17/2018  Check In: Session Check In - 03/17/18 1102      Check-In   Supervising physician immediately available to respond to emergencies  [facility] immediately available    Physician(s)  Dr. Bonner Puna    Location  [facility]    Staff Present  Rosebud Poles, RN, BSN;Carlette Wilber Oliphant, RN, Bjorn Loser, MS, Exercise Physiologist;Lisa Ysidro Evert, RN    Medication changes reported      No    Fall or balance concerns reported     No    Tobacco Cessation  No Change    Warm-up and Cool-down  Performed as group-led instruction    Resistance Training Performed  Yes    VAD Patient?  No    PAD/SET Patient?  No      Pain Assessment   Currently in Pain?  No/denies    Pain Score  0-No pain    Multiple Pain Sites  No       Capillary Blood Glucose: No results found for this or any previous visit (from the past 24 hour(s)).    Social History   Tobacco Use  Smoking Status Former Smoker  . Packs/day: 0.25  . Years: 3.00  . Pack years: 0.75  . Types: Cigars  . Last attempt to quit: 07/13/2017  . Years since quitting: 0.6  Smokeless Tobacco Never Used    Goals Met:  Proper associated with RPD/PD & O2 Sat Exercise tolerated well  Goals Unmet:  Not Applicable  Comments: Service time is from 1030 to 1225    Dr. Rush Farmer is Medical Director for Pulmonary Rehab at [facility].

## 2018-03-19 ENCOUNTER — Encounter (HOSPITAL_COMMUNITY)
Admission: RE | Admit: 2018-03-19 | Discharge: 2018-03-19 | Disposition: A | Discharge status: Home / Self Care | Payer: 59 | Source: Ambulatory Visit | Attending: Pulmonary Disease | Admitting: Pulmonary Disease

## 2018-03-19 VITALS — Wt 185.8 lb

## 2018-03-19 DIAGNOSIS — J849 Interstitial pulmonary disease, unspecified: Secondary | ICD-10-CM | POA: Diagnosis not present

## 2018-03-24 ENCOUNTER — Encounter (HOSPITAL_COMMUNITY): Payer: 59

## 2018-03-26 ENCOUNTER — Encounter (HOSPITAL_COMMUNITY): Payer: Self-pay

## 2018-03-26 ENCOUNTER — Encounter (HOSPITAL_COMMUNITY): Payer: 59

## 2018-03-26 NOTE — Progress Notes (Signed)
Pulmonary Individual Treatment Plan  Patient Details  Name: Timothy Andrews MRN: [id number] Date of Birth: 1995/08/22 Referring Provider:     Pulmonary Rehab Walk Test from 01/08/2018 in [place]  Referring Provider  Dr. Nelda Marseille      Initial Encounter Date:    Pulmonary Rehab Walk Test from 01/08/2018 in [place]  Date  01/08/18      Visit Diagnosis: ILD (interstitial lung disease) ([place])  Patient's Home Medications on Admission:   Current Outpatient Medications:  .  albuterol (PROVENTIL) (2.5 MG/3ML) 0.083% nebulizer solution, Take 3 mLs (2.5 mg total) by nebulization every 4 (four) hours as needed for wheezing or shortness of breath., Disp: 75 mL, Rfl: 12 .  budesonide-formoterol (SYMBICORT) 160-4.5 MCG/ACT inhaler, Inhale 2 puffs into the lungs 2 (two) times daily., Disp: 1 Inhaler, Rfl: 11 .  budesonide-formoterol (SYMBICORT) 160-4.5 MCG/ACT inhaler, Inhale 2 puffs into the lungs every 12 (twelve) hours., Disp: 1 Inhaler, Rfl: 6 .  Respiratory Therapy Supplies (FLUTTER) DEVI, Use as directed, Disp: 1 each, Rfl: 0 .  Tiotropium Bromide Monohydrate (SPIRIVA RESPIMAT) 2.5 MCG/ACT AERS, Inhale 2 puffs into the lungs daily., Disp: 1 Inhaler, Rfl: 5  Past Medical History: No past medical history on file.  Tobacco Use: Social History   Tobacco Use  Smoking Status Former Smoker  . Packs/day: 0.25  . Years: 3.00  . Pack years: 0.75  . Types: Cigars  . Last attempt to quit: 07/13/2017  . Years since quitting: 0.7  Smokeless Tobacco Never Used    Labs: Recent Review Flowsheet Data    Labs for ITP Cardiac and Pulmonary Rehab Latest Ref Rng & Units 07/13/2017 07/13/2017 07/13/2017 07/13/2017   PHART 7.350 - 7.450 7.219(L) 7.247(L) - -   PCO2ART 32.0 - 48.0 mmHg 51.2(H) 51.9(H) - -   HCO3 20.0 - 28.0 mmol/L 20.1 22.6 19.4(L) -   TCO2 22 - 32 mmol/L - 24 21(L) -   ACIDBASEDEF 0.0 - 2.0 mmol/L 6.3(H) 5.0(H) 9.0(H) -     O2SAT % 70.9 99.0 27.0 70.9      Capillary Blood Glucose: Lab Results  Component Value Date   GLUCAP 212 (H) 07/13/2017     Pulmonary Assessment Scores: Pulmonary Assessment Scores    Row Name 01/09/18 1005         ADL UCSD   ADL Phase  Entry       mMRC Score   mMRC Score  2        Pulmonary Function Assessment:   Exercise Target Goals: Exercise Program Goal: Individual exercise prescription set using results from initial 6 min walk test and THRR while considering  patient's activity barriers and safety.   Exercise Prescription Goal: Initial exercise prescription builds to 30-45 minutes a day of aerobic activity, 2-3 days per week.  Home exercise guidelines will be given to patient during program as part of exercise prescription that the participant will acknowledge.  Activity Barriers & Risk Stratification: Activity Barriers & Cardiac Risk Stratification - 01/07/18 1036      Activity Barriers & Cardiac Risk Stratification   Activity Barriers  Shortness of Breath;Back Problems   l knee hyperextended February 10th      6 Minute Walk: 6 Minute Walk    Row Name 01/09/18 1004         6 Minute Walk   Phase  Initial     Distance  1275 feet     Walk Time  6 minutes     # of Rest Breaks  0     MPH  2.41     METS  2.84     RPE  12     Perceived Dyspnea   1     Symptoms  No     Resting HR  73 bpm     Resting BP  134/84     Resting Oxygen Saturation   96 %     Exercise Oxygen Saturation  during 6 min walk  88 %     Max Ex. HR  77 bpm     Max Ex. BP  140/80       Interval HR   1 Minute HR  76     2 Minute HR  77     3 Minute HR  77     4 Minute HR  75     5 Minute HR  75     6 Minute HR  75     2 Minute Post HR  72     Interval Heart Rate?  Yes       Interval Oxygen   Interval Oxygen?  Yes     Baseline Oxygen Saturation %  96 %     1 Minute Oxygen Saturation %  94 %     1 Minute Liters of Oxygen  0 L     2 Minute Oxygen Saturation %  92 %      2 Minute Liters of Oxygen  0 L     3 Minute Oxygen Saturation %  89 %     3 Minute Liters of Oxygen  0 L     4 Minute Oxygen Saturation %  88 %     4 Minute Liters of Oxygen  0 L     5 Minute Oxygen Saturation %  90 %     5 Minute Liters of Oxygen  0 L     6 Minute Oxygen Saturation %  88 %     6 Minute Liters of Oxygen  0 L     2 Minute Post Oxygen Saturation %  96 %     2 Minute Post Liters of Oxygen  0 L        Oxygen Initial Assessment: Oxygen Initial Assessment - 01/09/18 1005      Initial 6 min Walk   Oxygen Used  None      Program Oxygen Prescription   Program Oxygen Prescription  None       Oxygen Re-Evaluation: Oxygen Re-Evaluation    Row Name 01/27/18 1018 02/25/18 0814 03/24/18 1631         Program Oxygen Prescription   Program Oxygen Prescription  None  None  None       Home Oxygen   Home Oxygen Device  None  None  None     Sleep Oxygen Prescription  None  None  None     Home Exercise Oxygen Prescription  None  None  None     Home at Rest Exercise Oxygen Prescription  None  None  None     Compliance with Home Oxygen Use  No  No  No       Goals/Expected Outcomes   Short Term Goals  -  To learn and understand importance of maintaining oxygen saturations>88%;To learn and demonstrate proper use of respiratory medications;To learn and understand importance of monitoring SPO2 with pulse oximeter and demonstrate accurate use of  the pulse oximeter.;To learn and demonstrate proper pursed lip breathing techniques or other breathing techniques.;To learn and exhibit compliance with exercise, home and travel O2 prescription  To learn and understand importance of maintaining oxygen saturations>88%;To learn and demonstrate proper use of respiratory medications;To learn and understand importance of monitoring SPO2 with pulse oximeter and demonstrate accurate use of the pulse oximeter.;To learn and demonstrate proper pursed lip breathing techniques or other breathing  techniques.;To learn and exhibit compliance with exercise, home and travel O2 prescription     Long  Term Goals  -  Verbalizes importance of monitoring SPO2 with pulse oximeter and return demonstration;Exhibits compliance with exercise, home and travel O2 prescription;Maintenance of O2 saturations>88%;Exhibits proper breathing techniques, such as pursed lip breathing or other method taught during program session;Compliance with respiratory medication;Demonstrates proper use of MDI's  Verbalizes importance of monitoring SPO2 with pulse oximeter and return demonstration;Exhibits compliance with exercise, home and travel O2 prescription;Maintenance of O2 saturations>88%;Exhibits proper breathing techniques, such as pursed lip breathing or other method taught during program session;Compliance with respiratory medication;Demonstrates proper use of MDI's     Goals/Expected Outcomes  -  -  compliance         Oxygen Discharge (Final Oxygen Re-Evaluation): Oxygen Re-Evaluation - 03/24/18 1631      Program Oxygen Prescription   Program Oxygen Prescription  None      Home Oxygen   Home Oxygen Device  None    Sleep Oxygen Prescription  None    Home Exercise Oxygen Prescription  None    Home at Rest Exercise Oxygen Prescription  None    Compliance with Home Oxygen Use  No      Goals/Expected Outcomes   Short Term Goals  To learn and understand importance of maintaining oxygen saturations>88%;To learn and demonstrate proper use of respiratory medications;To learn and understand importance of monitoring SPO2 with pulse oximeter and demonstrate accurate use of the pulse oximeter.;To learn and demonstrate proper pursed lip breathing techniques or other breathing techniques.;To learn and exhibit compliance with exercise, home and travel O2 prescription    Long  Term Goals  Verbalizes importance of monitoring SPO2 with pulse oximeter and return demonstration;Exhibits compliance with exercise, home and travel  O2 prescription;Maintenance of O2 saturations>88%;Exhibits proper breathing techniques, such as pursed lip breathing or other method taught during program session;Compliance with respiratory medication;Demonstrates proper use of MDI's    Goals/Expected Outcomes  compliance        Initial Exercise Prescription: Initial Exercise Prescription - 01/09/18 1000      Date of Initial Exercise RX and Referring Provider   Date  01/08/18    Referring Provider  Dr. Nelda Marseille      Treadmill   MPH  2.5    Grade  3    Minutes  17      Bike   Level  0.8    Minutes  17      Rower   Level  2    Watts  40    Minutes  17      Prescription Details   Frequency (times per week)  2    Duration  Progress to 45 minutes of aerobic exercise without signs/symptoms of physical distress      Intensity   THRR 40-80% of Max Heartrate  79-158    Ratings of Perceived Exertion  11-13    Perceived Dyspnea  0-4      Progression   Progression  Continue progressive overload as per policy without signs/symptoms  or physical distress.      Resistance Training   Training Prescription  Yes    Weight  blue bands    Reps  10-15       Perform Capillary Blood Glucose checks as needed.  Exercise Prescription Changes: Exercise Prescription Changes    Row Name 01/27/18 1200 02/10/18 1200 02/24/18 1200 03/05/18 1550 03/19/18 1234     Response to Exercise   Blood Pressure (Admit)  143/88  138/86  106/62  112/72  124/70   Blood Pressure (Exercise)  158/72  160/86  150/76  132/60  112/80   Blood Pressure (Exit)  130/90  112/90  110/60  98/70  116/74   Heart Rate (Admit)  60 bpm  78 bpm  70 bpm  82 bpm  62 bpm   Heart Rate (Exercise)  114 bpm  105 bpm  109 bpm  117 bpm  104 bpm   Heart Rate (Exit)  75 bpm  77 bpm  98 bpm  91 bpm  64 bpm   Oxygen Saturation (Admit)  95 %  95 %  94 %  93 %  96 %   Oxygen Saturation (Exercise)  91 %  92 %  92 %  88 %  93 %   Oxygen Saturation (Exit)  95 %  96 %  94 %  96 %  95 %    Rating of Perceived Exertion (Exercise)  13  14  12  13  13    Perceived Dyspnea (Exercise)  2  2  2  2  2    Duration  Progress to 45 minutes of aerobic exercise without signs/symptoms of physical distress  Progress to 45 minutes of aerobic exercise without signs/symptoms of physical distress  Progress to 45 minutes of aerobic exercise without signs/symptoms of physical distress  Progress to 45 minutes of aerobic exercise without signs/symptoms of physical distress  Progress to 45 minutes of aerobic exercise without signs/symptoms of physical distress   Intensity  THRR unchanged  THRR unchanged  THRR unchanged  THRR unchanged  THRR unchanged     Resistance Training   Training Prescription  Yes  Yes  Yes  Yes  Yes   Weight  blue bands  blue bands  blue bands  blue bands  blue bands   Reps  10-15  10-15  10-15  10-15  10-15   Time  -  -  10 Minutes  10 Minutes  10 Minutes     Interval Training   Interval Training  -  -  No  No  No     Treadmill   MPH  2.2  2.5  2.5  3  3    Grade  2  3  3  5  5    Minutes  17  17  17  17  17      Bike   Level  0.8  1.2  1.3  1.3  1.3   Minutes  17  17  17  17  17      Rower   Level  2  4  4   -  6   Watts  40  40  40  -  -   Minutes  17  17  17   -  17      Exercise Comments: Exercise Comments    Row Name 01/29/18 0828           Exercise Comments  Home exercise completed  Exercise Goals and Review: Exercise Goals    Row Name 01/07/18 1018             Exercise Goals   Increase Physical Activity  Yes       Intervention  Provide advice, education, support and counseling about physical activity/exercise needs.;Develop an individualized exercise prescription for aerobic and resistive training based on initial evaluation findings, risk stratification, comorbidities and participant's personal goals.       Expected Outcomes  Short Term: Attend rehab on a regular basis to increase amount of physical activity.;Long Term: Exercising regularly  at least 3-5 days a week.;Long Term: Add in home exercise to make exercise part of routine and to increase amount of physical activity.       Increase Strength and Stamina  Yes       Intervention  Provide advice, education, support and counseling about physical activity/exercise needs.;Develop an individualized exercise prescription for aerobic and resistive training based on initial evaluation findings, risk stratification, comorbidities and participant's personal goals.       Expected Outcomes  Short Term: Increase workloads from initial exercise prescription for resistance, speed, and METs.;Short Term: Perform resistance training exercises routinely during rehab and add in resistance training at home;Long Term: Improve cardiorespiratory fitness, muscular endurance and strength as measured by increased METs and functional capacity (6MWT)       Able to understand and use rate of perceived exertion (RPE) scale  Yes       Intervention  Provide education and explanation on how to use RPE scale       Expected Outcomes  Short Term: Able to use RPE daily in rehab to express subjective intensity level;Long Term:  Able to use RPE to guide intensity level when exercising independently       Able to understand and use Dyspnea scale  Yes       Intervention  Provide education and explanation on how to use Dyspnea scale       Expected Outcomes  Short Term: Able to use Dyspnea scale daily in rehab to express subjective sense of shortness of breath during exertion;Long Term: Able to use Dyspnea scale to guide intensity level when exercising independently       Knowledge and understanding of Target Heart Rate Range (THRR)  Yes       Intervention  Provide education and explanation of THRR including how the numbers were predicted and where they are located for reference       Expected Outcomes  Short Term: Able to state/look up THRR;Long Term: Able to use THRR to govern intensity when exercising independently;Short Term:  Able to use daily as guideline for intensity in rehab       Understanding of Exercise Prescription  Yes       Intervention  Provide education, explanation, and written materials on patient's individual exercise prescription       Expected Outcomes  Short Term: Able to explain program exercise prescription;Long Term: Able to explain home exercise prescription to exercise independently          Exercise Goals Re-Evaluation : Exercise Goals Re-Evaluation    Row Name 01/27/18 1018 02/25/18 0814 03/24/18 1632         Exercise Goal Re-Evaluation   Exercise Goals Review  Increase Physical Activity;Increase Strength and Stamina;Able to understand and use rate of perceived exertion (RPE) scale;Able to understand and use Dyspnea scale;Knowledge and understanding of Target Heart Rate Range (THRR);Understanding of Exercise Prescription  Increase Physical Activity;Increase Strength  and Stamina;Able to understand and use rate of perceived exertion (RPE) scale;Able to understand and use Dyspnea scale;Knowledge and understanding of Target Heart Rate Range (THRR);Understanding of Exercise Prescription  Increase Physical Activity;Increase Strength and Stamina;Able to understand and use rate of perceived exertion (RPE) scale;Able to understand and use Dyspnea scale;Knowledge and understanding of Target Heart Rate Range (THRR);Understanding of Exercise Prescription     Comments  Patient has only attended 3 rehab sessions. Patient is very gaurded and hesitant to workload changes. Takes numerous breaks during exercise. Will cont. to monitor and motivate as able.  Pt is gaurded and takes frequent breaks during exercise. Have went over breathing techniques with pt several times. Will continue to monitor and progress as able.   Pt is taking less frequent rest breaks, but still takes several. Pt is gaurded, so talking to him can be difficult at times. He is accepting to increased workloads and increases at a moderate MET level  of 5.36 while on the treadmill. Will continue to monitor and progress as able.      Expected Outcomes  Through exercise at rehab and at home, patient will increase physicial capacity and ADL's will be easier to perform at home. Patient will also develop exercise regime at home.  Through exercise at rehab and at home, patient will increase physicial capacity and ADL's will be easier to perform at home. Patient will also develop exercise regime at home.  Through exercise at rehab and at home, patient will increase physicial capacity and ADL's will be easier to perform at home. Patient will also develop exercise regime at home.        Discharge Exercise Prescription (Final Exercise Prescription Changes): Exercise Prescription Changes - 03/19/18 1234      Response to Exercise   Blood Pressure (Admit)  124/70    Blood Pressure (Exercise)  112/80    Blood Pressure (Exit)  116/74    Heart Rate (Admit)  62 bpm    Heart Rate (Exercise)  104 bpm    Heart Rate (Exit)  64 bpm    Oxygen Saturation (Admit)  96 %    Oxygen Saturation (Exercise)  93 %    Oxygen Saturation (Exit)  95 %    Rating of Perceived Exertion (Exercise)  13    Perceived Dyspnea (Exercise)  2    Duration  Progress to 45 minutes of aerobic exercise without signs/symptoms of physical distress    Intensity  THRR unchanged      Resistance Training   Training Prescription  Yes    Weight  blue bands    Reps  10-15    Time  10 Minutes      Interval Training   Interval Training  No      Treadmill   MPH  3    Grade  5    Minutes  17      Bike   Level  1.3    Minutes  17      Rower   Level  6    Minutes  17       Nutrition:  Target Goals: Understanding of nutrition guidelines, daily intake of sodium <1545m, cholesterol <202m calories 30% from fat and 7% or less from saturated fats, daily to have 5 or more servings of fruits and vegetables.  Biometrics:    Nutrition Therapy Plan and Nutrition Goals:   Nutrition  Assessments:   Nutrition Goals Re-Evaluation:   Nutrition Goals Discharge (Final Nutrition Goals Re-Evaluation):  Psychosocial: Target Goals: Acknowledge presence or absence of significant depression and/or stress, maximize coping skills, provide positive support system. Participant is able to verbalize types and ability to use techniques and skills needed for reducing stress and depression.  Initial Review & Psychosocial Screening: Initial Psych Review & Screening - 01/07/18 1518      Initial Review   Comments  pt has had to move back home with his mom and this has caused some intra familial strain, pt shortness of breath keeps him from being able to do ADL and work.      Family Dynamics   Comments  family has been supportive during his recovery      Barriers   Psychosocial barriers to participate in program  The patient should benefit from training in stress management and relaxation.       Quality of Life Scores:  Scores of 19 and below usually indicate a poorer quality of life in these areas.  A difference of  2-3 points is a clinically meaningful difference.  A difference of 2-3 points in the total score of the Quality of Life Index has been associated with significant improvement in overall quality of life, self-image, physical symptoms, and general health in studies assessing change in quality of life.  PHQ-9: Recent Review Flowsheet Data    Depression screen [facility] 2/9 01/07/2018 01/07/2018 01/17/2017 01/09/2017   Decreased Interest - 0 0 0   Down, Depressed, Hopeless - 2 0 0   PHQ - 2 Score - 2 0 0   Altered sleeping - 0 - -   Tired, decreased energy - 0 - -   Change in appetite - 0 - -   Feeling bad or failure about yourself  - 3 - -   Trouble concentrating - 0 - -   Moving slowly or fidgety/restless - 0 - -   Suicidal thoughts (No Data)  1 - -   PHQ-9 Score - 6 - -   Difficult doing work/chores - Very difficult - -     Interpretation of Total Score  Total Score  Depression Severity:  1-4 = Minimal depression, 5-9 = Mild depression, 10-14 = Moderate depression, 15-19 = Moderately severe depression, 20-27 = Severe depression   Psychosocial Evaluation and Intervention: Psychosocial Evaluation - 03/26/18 0916      Psychosocial Evaluation & Interventions   Comments  Pt feels stressed regarding his disease process and his living situation.  Pt continues to work at 2 jobs with [employer] and [employer]   Pt is saving his money which will hopefully give him some financial stability and further reduce his stress.    Expected Outcomes  Pt will be able to demonstrate positive and healthy coping skill to deal with life stressors.    Continue Psychosocial Services   Follow up required by staff       Psychosocial Re-Evaluation: Psychosocial Re-Evaluation    [place] Name 01/28/18 1720 02/25/18 1020 03/26/18 0917         Psychosocial Re-Evaluation   Current issues with  Current Depression;Current Stress Concerns  Current Depression;Current Stress Concerns  Current Depression;Current Stress Concerns     Comments  Pt is doing well in exercise at pulmonary rehab with the completion of 4 sessions.  Encourage exercise and attendance to educational classes.  Pt can become disengaged with the class, I beleive because of the huge age difference.  Pt will however engage with staff.  P  Pt continues to be quiet in class.  This  could be due to the age difference and pt does have a speech impediment when he is talking for longer periods of time.  Pt often complains of feeling tired and by choice he stays up to 2 am "hanging with his freinds".      Expected Outcomes  Pt will demonstrate positive and healthy coping skills.  Pt will utilize community resources such as vocational rehab to regain his independence.  Pt will demonstrate positive and healthy coping skills.  Pt will utilize community resources such as vocational rehab to regain his independence.  Pt will demonstrate positive and  healthy coping skills.  Pt will utilize community resources such as vocational rehab to regain his independence.     Interventions  Relaxation education;Encouraged to attend Pulmonary Rehabilitation for the exercise;Stress management education  Relaxation education;Encouraged to attend Pulmonary Rehabilitation for the exercise;Stress management education  Relaxation education;Encouraged to attend Pulmonary Rehabilitation for the exercise;Stress management education     Continue Psychosocial Services   Follow up required by staff  Follow up required by staff  -     Comments  pt has had to move back home with his mom and this has caused some intra familial strain, pt shortness of breath keeps him from being able to do ADL and work.  pt has had to move back home with his mom and this has caused some intra familial strain, pt shortness of breath keeps him from being able to do ADL and work.  pt has had to move back home with his mom and this has caused some intra familial strain, pt shortness of breath keeps him from being able to do ADL and work.       Initial Review   Source of Stress Concerns  Unable to perform yard/household activities;Financial;Family;Occupation;Unable to participate in former interests or hobbies  Unable to perform yard/household activities;Financial;Family;Occupation;Unable to participate in former interests or hobbies  Unable to perform yard/household activities;Financial;Family;Occupation;Unable to participate in former interests or hobbies        Psychosocial Discharge (Final Psychosocial Re-Evaluation): Psychosocial Re-Evaluation - 03/26/18 0917      Psychosocial Re-Evaluation   Current issues with  Current Depression;Current Stress Concerns    Comments  Pt continues to be quiet in class.  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  Pt often complains of feeling tired and by choice he stays up to 2  am "hanging with his freinds".     Expected Outcomes  Pt will demonstrate positive and healthy coping skills.  Pt will utilize community resources such as vocational rehab to regain his independence.    Interventions  Relaxation education;Encouraged to attend Pulmonary Rehabilitation for the exercise;Stress management education    Comments  pt has had to move back home with his mom and this has caused some intra familial strain, pt shortness of breath keeps him from being able to do ADL and work.      Initial Review   Source of Stress Concerns  Unable to perform yard/household activities;Financial;Family;Occupation;Unable to participate in former interests or hobbies       Education: Education Goals: Education classes will be provided on a weekly basis, covering required topics. Participant will state understanding/return demonstration of topics presented.  Learning Barriers/Preferences: Learning Barriers/Preferences - 01/07/18 1035      Learning Barriers/Preferences   Learning Barriers  None    Learning Preferences  Skilled Demonstration;Group Instruction;Video;Written Material  Education Topics: Risk Factor Reduction:  -Group instruction that is supported by a PowerPoint presentation. Instructor discusses the definition of a risk factor, different risk factors for pulmonary disease, and how the heart and lungs work together.     Nutrition for Pulmonary Patient:  -Group instruction provided by PowerPoint slides, verbal discussion, and written materials to support subject matter. The instructor gives an explanation and review of healthy diet recommendations, which includes a discussion on weight management, recommendations for fruit and vegetable consumption, as well as protein, fluid, caffeine, fiber, sodium, sugar, and alcohol. Tips for eating when patients are short of breath are discussed.   Pursed Lip Breathing:  -Group instruction that is supported by demonstration and  informational handouts. Instructor discusses the benefits of pursed lip and diaphragmatic breathing and detailed demonstration on how to preform both.     Oxygen Safety:  -Group instruction provided by PowerPoint, verbal discussion, and written material to support subject matter. There is an overview of "What is Oxygen" and "Why do we need it".  Instructor also reviews how to create a safe environment for oxygen use, the importance of using oxygen as prescribed, and the risks of noncompliance. There is a brief discussion on traveling with oxygen and resources the patient may utilize.   PULMONARY REHAB OTHER RESPIRATORY from 02/26/2018 in [place]  Date  01/15/18  Educator  molly  Instruction Review Code  1- Verbalizes Understanding      Oxygen Equipment:  -Group instruction provided by [employer], written materials, and Insurance underwriter.   PULMONARY REHAB OTHER RESPIRATORY from 02/26/2018 in [place]  Date  01/22/18  Educator  [employer]  Instruction Review Code  1- Verbalizes Understanding      Signs and Symptoms:  -Group instruction provided by written material and verbal discussion to support subject matter. Warning signs and symptoms of infection, stroke, and heart attack are reviewed and when to call the physician/911 reinforced. Tips for preventing the spread of infection discussed.   Advanced Directives:  -Group instruction provided by verbal instruction and written material to support subject matter. Instructor reviews Advanced Directive laws and proper instruction for filling out document.   Pulmonary Video:  -Group video education that reviews the importance of medication and oxygen compliance, exercise, good nutrition, pulmonary hygiene, and pursed lip and diaphragmatic breathing for the pulmonary patient.   Exercise for the Pulmonary Patient:  -Group instruction that is  supported by a PowerPoint presentation. Instructor discusses benefits of exercise, core components of exercise, frequency, duration, and intensity of an exercise routine, importance of utilizing pulse oximetry during exercise, safety while exercising, and options of places to exercise outside of rehab.     PULMONARY REHAB OTHER RESPIRATORY from 02/26/2018 in [place]  Date  01/29/18  Educator  EP  Instruction Review Code  1- Verbalizes Understanding      Pulmonary Medications:  -Verbally interactive group education provided by instructor with focus on inhaled medications and proper administration.   PULMONARY REHAB OTHER RESPIRATORY from 03/17/2018 in [place]  Date  03/17/18  Educator  Anderson Malta  Instruction Review Code  1- Actuary and Physiology of the Respiratory System and Intimacy:  -Group instruction provided by PowerPoint, verbal discussion, and written material to support subject matter. Instructor reviews respiratory cycle and anatomical components of the respiratory system and their functions. Instructor also reviews differences  in obstructive and restrictive respiratory diseases with examples of each. Intimacy, Sex, and Sexuality differences are reviewed with a discussion on how relationships can change when diagnosed with pulmonary disease. Common sexual concerns are reviewed.   PULMONARY REHAB OTHER RESPIRATORY from 02/26/2018 in [place]  Date  02/19/18  Educator  RN  Instruction Review Code  1- Verbalizes Understanding      MD DAY -A group question and answer session with a medical doctor that allows participants to ask questions that relate to their pulmonary disease state.   PULMONARY REHAB OTHER RESPIRATORY from 02/26/2018 in [place]  Date  02/05/18  Educator  [employer]ou[employer]  Instruction Review Code  1- Verbalizes  Understanding      OTHER EDUCATION -Group or individual verbal, written, or video instructions that support the educational goals of the pulmonary rehab program.   Holiday Eating Survival Tips:  -Group instruction provided by PowerPoint slides, verbal discussion, and written materials to support subject matter. The instructor gives patients tips, tricks, and techniques to help them not only survive but enjoy the holidays despite the onslaught of food that accompanies the holidays.   PULMONARY REHAB OTHER RESPIRATORY from 02/26/2018 in [place]  Date  02/26/18  Educator  -- [RD]  Instruction Review Code  1- Verbalizes Understanding      Knowledge Questionnaire Score:   Core Components/Risk Factors/Patient Goals at Admission: Personal Goals and Risk Factors at Admission - 01/07/18 1138      Core Components/Risk Factors/Patient Goals on Admission    Weight Management  Yes;Weight Maintenance    Intervention  Weight Management: Develop a combined nutrition and exercise program designed to reach desired caloric intake, while maintaining appropriate intake of nutrient and fiber, sodium and fats, and appropriate energy expenditure required for the weight goal.;Weight Management: Provide education and appropriate resources to help participant work on and attain dietary goals.;Weight Management/Obesity: Establish reasonable short term and long term weight goals.    Admit Weight  195 lb 12.3 oz (88.8 kg)    Expected Outcomes  Weight Maintenance: Understanding of the daily nutrition guidelines, which includes 25-35% calories from fat, 7% or less cal from saturated fats, less than 236m cholesterol, less than 1.5gm of sodium, & 5 or more servings of fruits and vegetables daily;Understanding recommendations for meals to include 15-35% energy as protein, 25-35% energy from fat, 35-60% energy from carbohydrates, less than 2048mof dietary cholesterol, 20-35 gm of total  fiber daily;Understanding of distribution of calorie intake throughout the day with the consumption of 4-5 meals/snacks;Short Term: Continue to assess and modify interventions until short term weight is achieved;Long Term: Adherence to nutrition and physical activity/exercise program aimed toward attainment of established weight goal    Improve shortness of breath with ADL's  Yes    Intervention  Provide education, individualized exercise plan and daily activity instruction to help decrease symptoms of SOB with activities of daily living.    Expected Outcomes  Short Term: Improve cardiorespiratory fitness to achieve a reduction of symptoms when performing ADLs;Long Term: Be able to perform more ADLs without symptoms or delay the onset of symptoms       Core Components/Risk Factors/Patient Goals Review:  Goals and Risk Factor Review    Row Name 01/28/18 1722 02/25/18 1022 03/26/18 0919         Core Components/Risk Factors/Patient Goals Review   Personal Goals Review  Weight Management/Obesity;Develop more efficient breathing techniques such as  purse lipped breathing and diaphragmatic breathing and practicing self-pacing with activity.;Improve shortness of breath with ADL's;Increase knowledge of respiratory medications and ability to use respiratory devices properly.  Weight Management/Obesity;Develop more efficient breathing techniques such as purse lipped breathing and diaphragmatic breathing and practicing self-pacing with activity.;Improve shortness of breath with ADL's;Increase knowledge of respiratory medications and ability to use respiratory devices properly.;Stress  Weight Management/Obesity;Develop more efficient breathing techniques such as purse lipped breathing and diaphragmatic breathing and practicing self-pacing with activity.;Improve shortness of breath with ADL's;Increase knowledge of respiratory medications and ability to use respiratory devices properly.;Stress     Review  Pt has  completed 4 exercise sessions and 2 education classes. Pt is doing well in rehab and this has been a tough transition for him due to his very young age in comparison to others.  Encourage pt to remain focused on his personal goals.  Pt weight remains unchanged from 10/3.  Pt has attended edcuation classes on oxygen and durable home equipment. Pt demonstrates good technique with PLB. Will hopefully move toward independence. workloads airydyne .8. rower level 2 and treadmill 2.2/2.0.  Continue to monitor pt progress during next 30 day review.  Pt has completed 12 exercise sessions and 5 education classes. Pt is doing well in rehab and this has been a tough transition for him due to his very young age in comparison to others.  Encourage pt to remain focused on his personal goals.  Pt weight rdecreased 3.5 kg.  Pt demonstrates good technique with PLB. Will hopefully move toward independence. workloads airydyne 1.3. rower increase to  level 4 and treadmill 2.5/3.0.  Continue to monitor pt progress during next 30 day review.  Pt has completed 17  exercise sessions and 8 education classes. Pt is doing well in rehab and this has been a tough transition for him due to his very young age in comparison to others.  Encourage pt to remain focused on his personal goals.  Pt weight decreased 3.9 kg.  Pt demonstrates good technique with PLB. and he will pace himself with breaks as needed.  Workloads airydyne 1.3. rower increase to  level 6 and treadmill 3.0/5.0.  Continue to monitor pt progress during next 30 day review.     Expected Outcomes  See admission goals/outcomes  See admission goals/outcomes  See admission goals/outcomes        Core Components/Risk Factors/Patient Goals at Discharge (Final Review):  Goals and Risk Factor Review - 03/26/18 0919      Core Components/Risk Factors/Patient Goals Review   Personal Goals Review  Weight Management/Obesity;Develop more efficient breathing techniques such as purse lipped  breathing and diaphragmatic breathing and practicing self-pacing with activity.;Improve shortness of breath with ADL's;Increase knowledge of respiratory medications and ability to use respiratory devices properly.;Stress    Review  Pt has completed 17  exercise sessions and 8 education classes. Pt is doing well in rehab and this has been a tough transition for him due to his very young age in comparison to others.  Encourage pt to remain focused on his personal goals.  Pt weight decreased 3.9 kg.  Pt demonstrates good technique with PLB. and he will pace himself with breaks as needed.  Workloads airydyne 1.3. rower increase to  level 6 and treadmill 3.0/5.0.  Continue to monitor pt progress during next 30 day review.    Expected Outcomes  See admission goals/outcomes       ITP Comments: ITP Comments    Row Name 01/07/18 1004 01/28/18 1718  02/25/18 1019 03/26/18 0915     ITP Comments  Dr. Manfred Arch, Medical Director Pulmonary Rehab  Dr. Manfred Arch, Medical Director Pulmonary Rehab  Dr. Manfred Arch, Medical Director Pulmonary Rehab  Dr. Manfred Arch, Medical Director Pulmonary Rehab       Comments: ITP REVIEW Pt is making expected progress toward personal goals after completing 17 exercise sessions.   Recommend continued exercise, life style modification, education, and utilization of breathing texhniques to increase stamina and strength and decrease shortness of breath with exertion. Cherre Huger, BSN Cardiac and Training and development officer

## 2018-03-31 ENCOUNTER — Encounter (HOSPITAL_COMMUNITY): Payer: 59

## 2018-04-01 ENCOUNTER — Telehealth (HOSPITAL_COMMUNITY): Payer: Self-pay | Admitting: *Deleted

## 2018-04-01 NOTE — Telephone Encounter (Signed)
Pt absent from pulmonary rehab x 3 sessions which is unlike pt.  Called and unable to leave message - kept ringing.  Noted in medical history [facility] however I am unable to see notes associated with this appt - unclear if he attended the appointment. Pt also had a birthday last week - maybe out of town??. Ngozi Alvidrez Industrial/product designerCarlton RN, BSN Cardiac and Emergency planning/management officerulmonary Rehab Nurse Navigator

## 2018-04-02 ENCOUNTER — Encounter (HOSPITAL_COMMUNITY): Payer: 59

## 2018-04-06 ENCOUNTER — Telehealth (HOSPITAL_COMMUNITY): Payer: Self-pay

## 2018-04-07 ENCOUNTER — Encounter (HOSPITAL_COMMUNITY): Payer: 59

## 2018-04-09 ENCOUNTER — Encounter (HOSPITAL_COMMUNITY): Payer: 59

## 2018-04-09 ENCOUNTER — Telehealth (HOSPITAL_COMMUNITY): Payer: Self-pay | Admitting: *Deleted

## 2018-04-09 NOTE — Telephone Encounter (Signed)
Pt with continued unexplained absences to pulmonary rehab.  Called and spoke to pt.  Pt car was repossessed for failure of payment.  Pt did not have a way to get to exercise nor a phone number to let us know he could not make it to rehab.  Pt has caught up on his car payments and needs to settle with the storage lot so he can get his car.  Arlys JohnBrian hopes to be able to return next week and admitted that he was missed not coming to exercise.  Pt stated he has an upcoming orientation for lung transplant in February. Alanson Alyarlette Caileen Veracruz RN, BSN Cardiac and Emergency planning/management officerulmonary Rehab Nurse Navigator

## 2018-04-14 ENCOUNTER — Encounter (HOSPITAL_COMMUNITY): Payer: 59

## 2018-04-16 ENCOUNTER — Encounter (HOSPITAL_COMMUNITY)
Admission: RE | Admit: 2018-04-16 | Discharge: 2018-04-16 | Disposition: A | Discharge status: Home / Self Care | Payer: 59 | Source: Ambulatory Visit | Attending: Pulmonary Disease | Admitting: Pulmonary Disease

## 2018-04-16 DIAGNOSIS — J849 Interstitial pulmonary disease, unspecified: Secondary | ICD-10-CM | POA: Diagnosis not present

## 2018-04-16 DIAGNOSIS — Z87891 Personal history of nicotine dependence: Secondary | ICD-10-CM | POA: Diagnosis not present

## 2018-04-16 DIAGNOSIS — Z7951 Long term (current) use of inhaled steroids: Secondary | ICD-10-CM | POA: Insufficient documentation

## 2018-04-16 DIAGNOSIS — Z79899 Other long term (current) drug therapy: Secondary | ICD-10-CM | POA: Insufficient documentation

## 2018-04-16 NOTE — Progress Notes (Signed)
Daily Session Note  Patient Details  Name: Timothy Andrews MRN: [id number] Date of Birth: 1995-04-28 Referring Provider:     Pulmonary Rehab Walk Test from 01/08/2018 in [place]  Referring Provider  Dr. Nelda Marseille      Encounter Date: 04/16/2018  Check In: Session Check In - 04/16/18 1102      Check-In   Supervising physician immediately available to respond to emergencies  [facility] immediately available    Physician(s)  Dr. Nevada Crane    Location  [facility]    Staff Present  Joycelyn Man RN, Maxcine Ham, RN, BSN;Carlette Wilber Oliphant, RN, BSN;Lisa Ysidro Evert, RN;Dalton Fletcher, MS, Exercise Physiologist    Medication changes reported      No    Fall or balance concerns reported     No    Tobacco Cessation  No Change    Warm-up and Cool-down  Performed as group-led instruction    Resistance Training Performed  Yes    VAD Patient?  No    PAD/SET Patient?  No      Pain Assessment   Currently in Pain?  No/denies    Pain Score  0-No pain    Multiple Pain Sites  No       Capillary Blood Glucose: No results found for this or any previous visit (from the past 24 hour(s)).    Social History   Tobacco Use  Smoking Status Former Smoker  . Packs/day: 0.25  . Years: 3.00  . Pack years: 0.75  . Types: Cigars  . Last attempt to quit: 07/13/2017  . Years since quitting: 0.7  Smokeless Tobacco Never Used    Goals Met:  Proper associated with RPD/PD & O2 Sat Exercise tolerated well Strength training completed today  Goals Unmet:  Not Applicable  Comments: Service time is from 1030 to 1220    Dr. Rush Farmer is Medical Director for Pulmonary Rehab at [facility]

## 2018-04-16 NOTE — Progress Notes (Signed)
I have reviewed the pulmonary graduate packet with [name] . Bently is scheduled to graduate on 04/23/2018. The patient was given an exercise prescription and stated that they were going to continue exercising at [facility].  Damel and I discussed how to progress their exercise prescription.  The patient stated that they understand the exercise prescription.  We reviewed exercise guidelines, target heart rate during exercise, RPE Scale, weather conditions, NTG use, endpoints for exercise, warmup and cool down.  The patient was also given information regarding the pulmonary maintenance program and given homework to complete and return before graduation. Patient is encouraged to come to me with any questions.

## 2018-04-21 ENCOUNTER — Encounter (HOSPITAL_COMMUNITY)
Admission: RE | Admit: 2018-04-21 | Discharge: 2018-04-21 | Disposition: A | Discharge status: Home / Self Care | Payer: 59 | Source: Ambulatory Visit | Attending: Pulmonary Disease | Admitting: Pulmonary Disease

## 2018-04-21 VITALS — Wt 185.6 lb

## 2018-04-21 DIAGNOSIS — J849 Interstitial pulmonary disease, unspecified: Secondary | ICD-10-CM | POA: Diagnosis not present

## 2018-04-23 ENCOUNTER — Encounter (HOSPITAL_COMMUNITY)
Admission: RE | Admit: 2018-04-23 | Discharge: 2018-04-23 | Disposition: A | Discharge status: Home / Self Care | Payer: 59 | Source: Ambulatory Visit | Attending: Pulmonary Disease | Admitting: Pulmonary Disease

## 2018-04-23 DIAGNOSIS — J849 Interstitial pulmonary disease, unspecified: Secondary | ICD-10-CM | POA: Diagnosis not present

## 2018-04-23 NOTE — Progress Notes (Signed)
Cardiac Individual Treatment Plan  Patient Details  Name: Timothy Andrews MRN: [id number] Date of Birth: 05-Apr-1996 Referring Provider:     Pulmonary Rehab Walk Test from 01/08/2018 in [facility]  Referring Provider  Dr. Nelda Marseille      Initial Encounter Date:    Pulmonary Rehab Walk Test from 01/08/2018 in [facility]  Date  01/08/18      Visit Diagnosis: ILD (interstitial lung disease) ([place])  Patient's Home Medications on Admission:  Current Outpatient Medications:  .  albuterol (PROVENTIL) (2.5 MG/3ML) 0.083% nebulizer solution, Take 3 mLs (2.5 mg total) by nebulization every 4 (four) hours as needed for wheezing or shortness of breath., Disp: 75 mL, Rfl: 12 .  budesonide-formoterol (SYMBICORT) 160-4.5 MCG/ACT inhaler, Inhale 2 puffs into the lungs 2 (two) times daily., Disp: 1 Inhaler, Rfl: 11 .  budesonide-formoterol (SYMBICORT) 160-4.5 MCG/ACT inhaler, Inhale 2 puffs into the lungs every 12 (twelve) hours., Disp: 1 Inhaler, Rfl: 6 .  Respiratory Therapy Supplies (FLUTTER) DEVI, Use as directed, Disp: 1 each, Rfl: 0 .  Tiotropium Bromide Monohydrate (SPIRIVA RESPIMAT) 2.5 MCG/ACT AERS, Inhale 2 puffs into the lungs daily., Disp: 1 Inhaler, Rfl: 5  Past Medical History: No past medical history on file.  Tobacco Use: Social History   Tobacco Use  Smoking Status Former Smoker  . Packs/day: 0.25  . Years: 3.00  . Pack years: 0.75  . Types: Cigars  . Last attempt to quit: 07/13/2017  . Years since quitting: 0.7  Smokeless Tobacco Never Used    Labs: Recent Review Flowsheet Data    Labs for ITP Cardiac and Pulmonary Rehab Latest Ref Rng & Units 07/13/2017 07/13/2017 07/13/2017 07/13/2017   PHART 7.350 - 7.450 7.219(L) 7.247(L) - -   PCO2ART 32.0 - 48.0 mmHg 51.2(H) 51.9(H) - -   HCO3 20.0 - 28.0 mmol/L 20.1 22.6 19.4(L) -   TCO2 22 - 32 mmol/L - 24 21(L) -   ACIDBASEDEF 0.0 - 2.0 mmol/L 6.3(H) 5.0(H) 9.0(H) -   O2SAT % 70.9 99.0 27.0 70.9      Capillary Blood Glucose: Lab Results  Component Value Date   GLUCAP 212 (H) 07/13/2017     Exercise Target Goals: Exercise Program Goal: Individual exercise prescription set using results from initial 6 min walk test and THRR while considering  patient's activity barriers and safety.   Exercise Prescription Goal: Initial exercise prescription builds to 30-45 minutes a day of aerobic activity, 2-3 days per week.  Home exercise guidelines will be given to patient during program as part of exercise prescription that the participant will acknowledge.  Activity Barriers & Risk Stratification: Activity Barriers & Cardiac Risk Stratification - 01/07/18 1036      Activity Barriers & Cardiac Risk Stratification   Activity Barriers  Shortness of Breath;Back Problems   l knee hyperextended February 10th      6 Minute Walk: 6 Minute Walk    Row Name 01/09/18 1004 04/23/18 1210 04/23/18 1249     6 Minute Walk   Phase  Initial  Discharge  Discharge   Distance  1275 feet  -  1496 feet   Distance Feet Change  -  -  221 ft   Walk Time  6 minutes  -  6 minutes   # of Rest Breaks  0  -  0   MPH  2.41  -  2.83   METS  2.84  -  3.14   RPE  12  -  11   Perceived Dyspnea   1  -  1   Symptoms  No  -  No   Resting HR  73 bpm  -  73 bpm   Resting BP  134/84  -  110/62   Resting Oxygen Saturation   96 %  -  97 %   Exercise Oxygen Saturation  during 6 min walk  88 %  -  85 %   Max Ex. HR  77 bpm  -  111 bpm   Max Ex. BP  140/80  -  140/74   2 Minute Post BP  -  -  112/72     Interval HR   1 Minute HR  76  -  98   2 Minute HR  77  -  101   3 Minute HR  77  -  102   4 Minute HR  75  -  102   5 Minute HR  75  -  109   6 Minute HR  75  -  111   2 Minute Post HR  72  -  68   Interval Heart Rate?  Yes  -  Yes     Interval Oxygen   Interval Oxygen?  Yes  -  Yes   Baseline Oxygen Saturation %  96 %  -  97 %   1 Minute Oxygen Saturation %  94 %  -  96 %   1  Minute Liters of Oxygen  0 L  -  0 L   2 Minute Oxygen Saturation %  92 %  -  93 %   2 Minute Liters of Oxygen  0 L  -  0 L   3 Minute Oxygen Saturation %  89 %  -  92 %   3 Minute Liters of Oxygen  0 L  -  0 L   4 Minute Oxygen Saturation %  88 %  -  89 %   4 Minute Liters of Oxygen  0 L  -  0 L   5 Minute Oxygen Saturation %  90 %  -  86 %   5 Minute Liters of Oxygen  0 L  -  0 L   6 Minute Oxygen Saturation %  88 %  -  85 %   6 Minute Liters of Oxygen  0 L  -  0 L   2 Minute Post Oxygen Saturation %  96 %  -  98 %   2 Minute Post Liters of Oxygen  0 L  -  0 L      Oxygen Initial Assessment: Oxygen Initial Assessment - 01/09/18 1005      Initial 6 min Walk   Oxygen Used  None      Program Oxygen Prescription   Program Oxygen Prescription  None       Oxygen Re-Evaluation: Oxygen Re-Evaluation    Row Name 01/27/18 1018 02/25/18 0814 03/24/18 1631 04/21/18 0757       Program Oxygen Prescription   Program Oxygen Prescription  None  None  None  None      Home Oxygen   Home Oxygen Device  None  None  None  None    Sleep Oxygen Prescription  None  None  None  None    Home Exercise Oxygen Prescription  None  None  None  None    Home at Rest Exercise Oxygen Prescription  None  None  None  None    Compliance with Home Oxygen Use  No  No  No  No      Goals/Expected Outcomes   Short Term Goals  -  To learn and understand importance of maintaining oxygen saturations>88%;To learn and demonstrate proper use of respiratory medications;To learn and understand importance of monitoring SPO2 with pulse oximeter and demonstrate accurate use of the pulse oximeter.;To learn and demonstrate proper pursed lip breathing techniques or other breathing techniques.;To learn and exhibit compliance with exercise, home and travel O2 prescription  To learn and understand importance of maintaining oxygen saturations>88%;To learn and demonstrate proper use of respiratory medications;To learn and understand  importance of monitoring SPO2 with pulse oximeter and demonstrate accurate use of the pulse oximeter.;To learn and demonstrate proper pursed lip breathing techniques or other breathing techniques.;To learn and exhibit compliance with exercise, home and travel O2 prescription  To learn and understand importance of maintaining oxygen saturations>88%;To learn and demonstrate proper use of respiratory medications;To learn and understand importance of monitoring SPO2 with pulse oximeter and demonstrate accurate use of the pulse oximeter.;To learn and demonstrate proper pursed lip breathing techniques or other breathing techniques.;To learn and exhibit compliance with exercise, home and travel O2 prescription    Long  Term Goals  -  Verbalizes importance of monitoring SPO2 with pulse oximeter and return demonstration;Exhibits compliance with exercise, home and travel O2 prescription;Maintenance of O2 saturations>88%;Exhibits proper breathing techniques, such as pursed lip breathing or other method taught during program session;Compliance with respiratory medication;Demonstrates proper use of MDI's  Verbalizes importance of monitoring SPO2 with pulse oximeter and return demonstration;Exhibits compliance with exercise, home and travel O2 prescription;Maintenance of O2 saturations>88%;Exhibits proper breathing techniques, such as pursed lip breathing or other method taught during program session;Compliance with respiratory medication;Demonstrates proper use of MDI's  Verbalizes importance of monitoring SPO2 with pulse oximeter and return demonstration;Exhibits compliance with exercise, home and travel O2 prescription;Maintenance of O2 saturations>88%;Exhibits proper breathing techniques, such as pursed lip breathing or other method taught during program session;Compliance with respiratory medication;Demonstrates proper use of MDI's    Goals/Expected Outcomes  -  -  compliance   compliance        Oxygen Discharge  (Final Oxygen Re-Evaluation): Oxygen Re-Evaluation - 04/21/18 0757      Program Oxygen Prescription   Program Oxygen Prescription  None      Home Oxygen   Home Oxygen Device  None    Sleep Oxygen Prescription  None    Home Exercise Oxygen Prescription  None    Home at Rest Exercise Oxygen Prescription  None    Compliance with Home Oxygen Use  No      Goals/Expected Outcomes   Short Term Goals  To learn and understand importance of maintaining oxygen saturations>88%;To learn and demonstrate proper use of respiratory medications;To learn and understand importance of monitoring SPO2 with pulse oximeter and demonstrate accurate use of the pulse oximeter.;To learn and demonstrate proper pursed lip breathing techniques or other breathing techniques.;To learn and exhibit compliance with exercise, home and travel O2 prescription    Long  Term Goals  Verbalizes importance of monitoring SPO2 with pulse oximeter and return demonstration;Exhibits compliance with exercise, home and travel O2 prescription;Maintenance of O2 saturations>88%;Exhibits proper breathing techniques, such as pursed lip breathing or other method taught during program session;Compliance with respiratory medication;Demonstrates proper use of MDI's    Goals/Expected Outcomes  compliance        Initial Exercise Prescription: Initial Exercise  Prescription - 01/09/18 1000      Date of Initial Exercise RX and Referring Provider   Date  01/08/18    Referring Provider  Dr. Nelda Marseille      Treadmill   MPH  2.5    Grade  3    Minutes  17      Bike   Level  0.8    Minutes  17      Rower   Level  2    Watts  40    Minutes  17      Prescription Details   Frequency (times per week)  2    Duration  Progress to 45 minutes of aerobic exercise without signs/symptoms of physical distress      Intensity   THRR 40-80% of Max Heartrate  79-158    Ratings of Perceived Exertion  11-13    Perceived Dyspnea  0-4      Progression    Progression  Continue progressive overload as per policy without signs/symptoms or physical distress.      Resistance Training   Training Prescription  Yes    Weight  blue bands    Reps  10-15       Perform Capillary Blood Glucose checks as needed.  Exercise Prescription Changes:  Exercise Prescription Changes    Row Name 01/27/18 1200 02/10/18 1200 02/24/18 1200 03/05/18 1550 03/19/18 1234     Response to Exercise   Blood Pressure (Admit)  143/88  138/86  106/62  112/72  124/70   Blood Pressure (Exercise)  158/72  160/86  150/76  132/60  112/80   Blood Pressure (Exit)  130/90  112/90  110/60  98/70  116/74   Heart Rate (Admit)  60 bpm  78 bpm  70 bpm  82 bpm  62 bpm   Heart Rate (Exercise)  114 bpm  105 bpm  109 bpm  117 bpm  104 bpm   Heart Rate (Exit)  75 bpm  77 bpm  98 bpm  91 bpm  64 bpm   Oxygen Saturation (Admit)  95 %  95 %  94 %  93 %  96 %   Oxygen Saturation (Exercise)  91 %  92 %  92 %  88 %  93 %   Oxygen Saturation (Exit)  95 %  96 %  94 %  96 %  95 %   Rating of Perceived Exertion (Exercise)  _0 Perceived Dyspnea (Exercise)  _1 Duration  Progress to 45 minutes of aerobic exercise without signs/symptoms of physical distress  Progress to 45 minutes of aerobic exercise without signs/symptoms of physical distress  Progress to 45 minutes of aerobic exercise without signs/symptoms of physical distress  Progress to 45 minutes of aerobic exercise without signs/symptoms of physical distress  Progress to 45 minutes of aerobic exercise without signs/symptoms of physical distress   Intensity  THRR unchanged  THRR unchanged  THRR unchanged  THRR unchanged  THRR unchanged     Resistance Training   Training Prescription  Yes  Yes  Yes  Yes  Yes   Weight  blue bands  blue bands  blue bands  blue bands  blue bands   Reps  10-15  10-15  10-15  10-15  10-15   Time  -  -  10 Minutes  10 Minutes  10 Minutes  Interval Training   Interval Training  -   -  No  No  No     Treadmill   MPH  2.2  2.5  2._0 Grade  _1 Minutes  _2 Bike   Level  0.8  1.2  1.3  1.3  1.3   Minutes  _3 Rower   Level  _4 -  6   Watts  40  40  40  -  -   Minutes  _5 -  17   Row Name 04/21/18 1200             Response to Exercise   Blood Pressure (Admit)  108/67       Blood Pressure (Exercise)  140/80       Blood Pressure (Exit)  122/76       Heart Rate (Admit)  88 bpm       Heart Rate (Exercise)  124 bpm       Heart Rate (Exit)  90 bpm       Oxygen Saturation (Admit)  95 %       Oxygen Saturation (Exercise)  89 %       Oxygen Saturation (Exit)  97 %       Rating of Perceived Exertion (Exercise)  12       Perceived Dyspnea (Exercise)  2       Duration  Progress to 45 minutes of aerobic exercise without signs/symptoms of physical distress       Intensity  THRR unchanged         Resistance Training   Training Prescription  Yes       Weight  blue bands       Reps  10-15       Time  10 Minutes         Interval Training   Interval Training  No         Treadmill   MPH  2.5       Grade  7       Minutes  17         Bike   Level  1.3       Minutes  17         Rower   Level  6       Minutes  17          Exercise Comments:  Exercise Comments    Row Name 01/29/18 0828           Exercise Comments  Home exercise completed          Exercise Goals and Review:  Exercise Goals    Row Name 01/07/18 1018             Exercise Goals   Increase Physical Activity  Yes       Intervention  Provide advice, education, support and counseling about physical activity/exercise needs.;Develop an individualized exercise prescription for aerobic and resistive training based on initial evaluation findings, risk stratification, comorbidities and participant's personal goals.       Expected Outcomes  Short Term: Attend rehab on a regular basis to increase amount of physical  activity.;Long Term: Exercising regularly at least  3-5 days a week.;Long Term: Add in home exercise to make exercise part of routine and to increase amount of physical activity.       Increase Strength and Stamina  Yes       Intervention  Provide advice, education, support and counseling about physical activity/exercise needs.;Develop an individualized exercise prescription for aerobic and resistive training based on initial evaluation findings, risk stratification, comorbidities and participant's personal goals.       Expected Outcomes  Short Term: Increase workloads from initial exercise prescription for resistance, speed, and METs.;Short Term: Perform resistance training exercises routinely during rehab and add in resistance training at home;Long Term: Improve cardiorespiratory fitness, muscular endurance and strength as measured by increased METs and functional capacity (6MWT)       Able to understand and use rate of perceived exertion (RPE) scale  Yes       Intervention  Provide education and explanation on how to use RPE scale       Expected Outcomes  Short Term: Able to use RPE daily in rehab to express subjective intensity level;Long Term:  Able to use RPE to guide intensity level when exercising independently       Able to understand and use Dyspnea scale  Yes       Intervention  Provide education and explanation on how to use Dyspnea scale       Expected Outcomes  Short Term: Able to use Dyspnea scale daily in rehab to express subjective sense of shortness of breath during exertion;Long Term: Able to use Dyspnea scale to guide intensity level when exercising independently       Knowledge and understanding of Target Heart Rate Range (THRR)  Yes       Intervention  Provide education and explanation of THRR including how the numbers were predicted and where they are located for reference       Expected Outcomes  Short Term: Able to state/look up THRR;Long Term: Able to use THRR to govern intensity  when exercising independently;Short Term: Able to use daily as guideline for intensity in rehab       Understanding of Exercise Prescription  Yes       Intervention  Provide education, explanation, and written materials on patient's individual exercise prescription       Expected Outcomes  Short Term: Able to explain program exercise prescription;Long Term: Able to explain home exercise prescription to exercise independently          Exercise Goals Re-Evaluation : Exercise Goals Re-Evaluation    Row Name 01/27/18 1018 02/25/18 0814 03/24/18 1632 04/21/18 0757       Exercise Goal Re-Evaluation   Exercise Goals Review  Increase Physical Activity;Increase Strength and Stamina;Able to understand and use rate of perceived exertion (RPE) scale;Able to understand and use Dyspnea scale;Knowledge and understanding of Target Heart Rate Range (THRR);Understanding of Exercise Prescription  Increase Physical Activity;Increase Strength and Stamina;Able to understand and use rate of perceived exertion (RPE) scale;Able to understand and use Dyspnea scale;Knowledge and understanding of Target Heart Rate Range (THRR);Understanding of Exercise Prescription  Increase Physical Activity;Increase Strength and Stamina;Able to understand and use rate of perceived exertion (RPE) scale;Able to understand and use Dyspnea scale;Knowledge and understanding of Target Heart Rate Range (THRR);Understanding of Exercise Prescription  Increase Physical Activity;Increase Strength and Stamina;Able to understand and use rate of perceived exertion (RPE) scale;Able to understand and use Dyspnea scale;Knowledge and understanding of Target Heart Rate Range (THRR);Understanding of Exercise Prescription    Comments  Patient has only attended 3 rehab sessions. Patient is very gaurded and hesitant to workload changes. Takes numerous breaks during exercise. Will cont. to monitor and motivate as able.  Pt is gaurded and takes frequent breaks during  exercise. Have went over breathing techniques with pt several times. Will continue to monitor and progress as able.   Pt is taking less frequent rest breaks, but still takes several. Pt is gaurded, so talking to him can be difficult at times. He is accepting to increased workloads and increases at a moderate MET level of 5.36 while on the treadmill. Will continue to monitor and progress as able.   Pt missed a few weeks of exercise due to financial difficulties. Pt has returned for 1 visit and will graduate on 1/9. Pt exercises at 5.36 METs on the treadmill but continue to take breaks but they are less frequent. Pt states that he will continue to exercise at a local gym. Will continue to monitor and progress as able.     Expected Outcomes  Through exercise at rehab and at home, patient will increase physicial capacity and ADL's will be easier to perform at home. Patient will also develop exercise regime at home.  Through exercise at rehab and at home, patient will increase physicial capacity and ADL's will be easier to perform at home. Patient will also develop exercise regime at home.  Through exercise at rehab and at home, patient will increase physicial capacity and ADL's will be easier to perform at home. Patient will also develop exercise regime at home.  Through exercise at rehab and at home, patient will increase physicial capacity and ADL's will be easier to perform at home. Patient will also develop exercise regime at home.       Discharge Exercise Prescription (Final Exercise Prescription Changes): Exercise Prescription Changes - 04/21/18 1200      Response to Exercise   Blood Pressure (Admit)  108/67    Blood Pressure (Exercise)  140/80    Blood Pressure (Exit)  122/76    Heart Rate (Admit)  88 bpm    Heart Rate (Exercise)  124 bpm    Heart Rate (Exit)  90 bpm    Oxygen Saturation (Admit)  95 %    Oxygen Saturation (Exercise)  89 %    Oxygen Saturation (Exit)  97 %    Rating of Perceived  Exertion (Exercise)  12    Perceived Dyspnea (Exercise)  2    Duration  Progress to 45 minutes of aerobic exercise without signs/symptoms of physical distress    Intensity  THRR unchanged      Resistance Training   Training Prescription  Yes    Weight  blue bands    Reps  10-15    Time  10 Minutes      Interval Training   Interval Training  No      Treadmill   MPH  2.5    Grade  7    Minutes  17      Bike   Level  1.3    Minutes  17      Rower   Level  6    Minutes  17       Nutrition:  Target Goals: Understanding of nutrition guidelines, daily intake of sodium <1573m, cholesterol <2048m calories 30% from fat and 7% or less from saturated fats, daily to have 5 or more servings of fruits and vegetables.  Biometrics:    Nutrition Therapy Plan and Nutrition  Goals:   Nutrition Assessments:   Nutrition Goals Re-Evaluation:   Nutrition Goals Re-Evaluation:   Nutrition Goals Discharge (Final Nutrition Goals Re-Evaluation):   Psychosocial: Target Goals: Acknowledge presence or absence of significant depression and/or stress, maximize coping skills, provide positive support system. Participant is able to verbalize types and ability to use techniques and skills needed for reducing stress and depression.  Initial Review & Psychosocial Screening: Initial Psych Review & Screening - 01/07/18 1518      Initial Review   Comments  pt has had to move back home with his mom and this has caused some intra familial strain, pt shortness of breath keeps him from being able to do ADL and work.      Family Dynamics   Comments  family has been supportive during his recovery      Barriers   Psychosocial barriers to participate in program  The patient should benefit from training in stress management and relaxation.       Quality of Life Scores:  Scores of 19 and below usually indicate a poorer quality of life in these areas.  A difference of  2-3 points is a clinically  meaningful difference.  A difference of 2-3 points in the total score of the Quality of Life Index has been associated with significant improvement in overall quality of life, self-image, physical symptoms, and general health in studies assessing change in quality of life.  PHQ-9: Recent Review Flowsheet Data    Depression screen [facility] 2/9 04/23/2018 01/07/2018 01/07/2018 01/17/2017 01/09/2017   Decreased Interest 0 - 0 0 0   Down, Depressed, Hopeless 0 - 2 0 0   PHQ - 2 Score 0 - 2 0 0   Altered sleeping 0 - 0 - -   Tired, decreased energy 0 - 0 - -   Change in appetite 0 - 0 - -   Feeling bad or failure about yourself  0 - 3 - -   Trouble concentrating 0 - 0 - -   Moving slowly or fidgety/restless 0 - 0 - -   Suicidal thoughts 0 (No Data)  1 - -   PHQ-9 Score 0 - 6 - -   Difficult doing work/chores - - Very difficult - -     Interpretation of Total Score  Total Score Depression Severity:  1-4 = Minimal depression, 5-9 = Mild depression, 10-14 = Moderate depression, 15-19 = Moderately severe depression, 20-27 = Severe depression   Psychosocial Evaluation and Intervention: Psychosocial Evaluation - 03/26/18 0916      Psychosocial Evaluation & Interventions   Comments  Pt feels stressed regarding his disease process and his living situation.  Pt continues to work at 2 jobs with [employer] and [employer]   Pt is saving his money which will hopefully give him some financial stability and further reduce his stress.    Expected Outcomes  Pt will be able to demonstrate positive and healthy coping skill to deal with life stressors.    Continue Psychosocial Services   Follow up required by staff       Psychosocial Re-Evaluation: Psychosocial Re-Evaluation    Row Name 01/28/18 1720 02/25/18 1020 03/26/18 0917 04/23/18 0930       Psychosocial Re-Evaluation   Current issues with  Current Depression;Current Stress Concerns  Current Depression;Current Stress Concerns  Current Depression;Current Stress  Concerns  Current Depression;Current Stress Concerns    Comments  Pt is doing well in exercise at pulmonary rehab with the completion of 4  sessions.  Encourage exercise and attendance to educational classes.  Pt can become disengaged with the class, I beleive because of the huge age difference.  Pt will however engage with staff.  P  Pt continues to be quiet in class.  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  Pt often complains of feeling tired and by choice he stays up to 2 am "hanging with his freinds".   Pt continues to be quiet in class, potentially due to age difference and existing speech impediment. Pt has financial stressor that have been barriers to his success in the program. Pt was absent from excersie for approximately a month d/t his care being reposessed. He was able to get his car back and he has completed the program and is set to graduate today.     Expected Outcomes  Pt will demonstrate positive and healthy coping skills.  Pt will utilize community resources such as vocational rehab to regain his independence.  Pt will demonstrate positive and healthy coping skills.  Pt will utilize community resources such as vocational rehab to regain his independence.  Pt will demonstrate positive and healthy coping skills.  Pt will utilize community resources such as vocational rehab to regain his independence.  Pt will demonstrate positive and healthy coping skills.  Pt will utilize community resources such as vocational rehab to regain his independence.    Interventions  Relaxation education;Encouraged to attend Pulmonary Rehabilitation for the exercise;Stress management education  Relaxation education;Encouraged to attend Pulmonary Rehabilitation for the exercise;Stress management education  Relaxation education;Encouraged to attend Pulmonary Rehabilitation for the exercise;Stress management education  Relaxation  education;Encouraged to attend Pulmonary Rehabilitation for the exercise;Stress management education    Continue Psychosocial Services   Follow up required by staff  Follow up required by staff  -  Follow up required by staff    Comments  pt has had to move back home with his mom and this has caused some intra familial strain, pt shortness of breath keeps him from being able to do ADL and work.  pt has had to move back home with his mom and this has caused some intra familial strain, pt shortness of breath keeps him from being able to do ADL and work.  pt has had to move back home with his mom and this has caused some intra familial strain, pt shortness of breath keeps him from being able to do ADL and work.  -      Initial Review   Source of Stress Concerns  Unable to perform yard/household activities;Financial;Family;Occupation;Unable to participate in former interests or hobbies  Unable to perform yard/household activities;Financial;Family;Occupation;Unable to participate in former interests or hobbies  Unable to perform yard/household activities;Financial;Family;Occupation;Unable to participate in former interests or hobbies  -       Psychosocial Discharge (Final Psychosocial Re-Evaluation): Psychosocial Re-Evaluation - 04/23/18 0930      Psychosocial Re-Evaluation   Current issues with  Current Depression;Current Stress Concerns    Comments  Pt continues to be quiet in class, potentially due to age difference and existing speech impediment. Pt has financial stressor that have been barriers to his success in the program. Pt was absent from [facility] for approximately a month d/t his care being reposessed. He was able to get his car back and he has completed the program and is set to graduate today.     Expected Outcomes  Pt  will demonstrate positive and healthy coping skills.  Pt will utilize community resources such as vocational rehab to regain his independence.    Interventions  Relaxation  education;Encouraged to attend Pulmonary Rehabilitation for the exercise;Stress management education    Continue Psychosocial Services   Follow up required by staff       Vocational Rehabilitation: Provide vocational rehab assistance to qualifying candidates.   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 Participant will state understanding/return demonstration of topics presented.  Learning Barriers/Preferences: Learning Barriers/Preferences - 01/07/18 1035      Learning Barriers/Preferences   Learning Barriers  None    Learning Preferences  Skilled Demonstration;Group Instruction;Video;Written Material       Education Topics: Count Your Pulse:  -Group instruction provided by verbal instruction, demonstration, patient participation and written materials to support subject.  Instructors address importance of being able to find your pulse and how to count your pulse when at home without a heart monitor.  Patients get hands on experience counting their pulse with staff help and individually.   Heart Attack, Angina, and Risk Factor Modification:  -Group instruction provided by verbal instruction, video, and written materials to support subject.  Instructors address signs and symptoms of angina and heart attacks.    Also discuss risk factors for heart disease and how to make changes to improve heart health risk factors.   Functional Fitness:  -Group instruction provided by verbal instruction, demonstration, patient participation, and written materials to support subject.  Instructors address safety  measures for doing things around the house.  Discuss how to get up and down off the floor, how to pick things up properly, how to safely get out of a chair without assistance, and balance training.   Meditation and Mindfulness:  -Group instruction provided by verbal instruction, patient participation, and written materials to support subject.  Instructor addresses importance of mindfulness and meditation practice to help reduce stress and improve awareness.  Instructor also leads participants through a meditation exercise.    PULMONARY REHAB OTHER RESPIRATORY from 02/26/2018 in [facility]  Date  02/12/18  Educator  bh  Instruction Review Code  1- Verbalizes Understanding      Stretching for Flexibility and Mobility:  -Group instruction provided by verbal instruction, patient participation, and written materials to support subject.  Instructors lead participants through series of stretches that are designed to increase flexibility thus improving mobility.  These stretches are additional exercise for major muscle groups that are typically performed during regular warm up and cool down.   Hands Only CPR:  -Group verbal, video, and participation provides a basic overview of AHA guidelines for community CPR. Role-play of emergencies allow participants the opportunity to practice calling for help and chest compression technique with discussion of AED use.   Hypertension: -Group verbal and written instruction that provides a basic overview of hypertension including the most recent diagnostic guidelines, risk factor reduction with self-care instructions and medication management.    Nutrition I class: Heart Healthy Eating:  -Group instruction provided by PowerPoint slides, verbal discussion, and  written materials to support subject matter. The instructor gives an explanation and review of the Therapeutic Lifestyle Changes diet recommendations, which includes a  discussion on lipid goals, dietary fat, sodium, fiber, plant stanol/sterol esters, sugar, and the components of a well-balanced, healthy diet.   Nutrition II class: Lifestyle Skills:  -Group instruction provided by PowerPoint slides, verbal discussion, and written materials to support subject matter. The instructor gives an explanation and review of label reading, grocery shopping for heart health, heart healthy recipe modifications, and ways to make healthier choices when eating out.   Diabetes Question & Answer:  -Group instruction provided by PowerPoint slides, verbal discussion, and written materials to support subject matter. The instructor gives an explanation and review of diabetes co-morbidities, pre- and post-prandial blood glucose goals, pre-exercise blood glucose goals, signs, symptoms, and treatment of hypoglycemia and hyperglycemia, and foot care basics.   Diabetes Blitz:  -Group instruction provided by PowerPoint slides, verbal discussion, and written materials to support subject matter. The instructor gives an explanation and review of the physiology behind type 1 and type 2 diabetes, diabetes medications and rational behind using different medications, pre- and post-prandial blood glucose recommendations and Hemoglobin A1c goals, diabetes diet, and exercise including blood glucose guidelines for exercising safely.    Portion Distortion:  -Group instruction provided by PowerPoint slides, verbal discussion, written materials, and food models to support subject matter. The instructor gives an explanation of serving size versus portion size, changes in portions sizes over the last 20 years, and what consists of a serving from each food group.   Stress Management:  -Group instruction provided by verbal instruction, video, and written materials to support subject matter.  Instructors review role of stress in heart disease and how to cope with stress positively.     Exercising on Your  Own:  -Group instruction provided by verbal instruction, power point, and written materials to support subject.  Instructors discuss benefits of exercise, components of exercise, frequency and intensity of exercise, and end points for exercise.  Also discuss use of nitroglycerin and activating EMS.  Review options of places to exercise outside of rehab.  Review guidelines for sex with heart disease.   Cardiac Drugs I:  -Group instruction provided by verbal instruction and written materials to support subject.  Instructor reviews cardiac drug classes: antiplatelets, anticoagulants, beta blockers, and statins.  Instructor discusses reasons, side effects, and lifestyle considerations for each drug class.   Cardiac Drugs II:  -Group instruction provided by verbal instruction and written materials to support subject.  Instructor reviews cardiac drug classes: angiotensin converting enzyme inhibitors (ACE-I), angiotensin II receptor blockers (ARBs), nitrates, and calcium channel blockers.  Instructor discusses reasons, side effects, and lifestyle considerations for each drug class.   Anatomy and Physiology of the Circulatory System:  Group verbal and written instruction and models provide basic cardiac anatomy and physiology, with the coronary electrical and arterial systems. Review of: AMI, Angina, Valve disease, Heart Failure, Peripheral Artery Disease, Cardiac Arrhythmia, Pacemakers, and the ICD.   Other Education:  -Group or individual verbal, written, or video instructions that support the educational goals of the cardiac rehab program.   Holiday Eating Survival Tips:  -Group instruction provided by PowerPoint slides, verbal discussion, and written materials to support subject matter. The instructor gives patients tips, tricks, and techniques to help them not only survive but enjoy the holidays despite the onslaught of food that accompanies the holidays.   PULMONARY REHAB OTHER RESPIRATORY from  02/26/2018 in [place]  [place]  Date  02/26/18  Educator  -- [RD]  Instruction Review Code  1- Verbalizes Understanding      Knowledge Questionnaire Score: Knowledge Questionnaire Score - 04/22/18 1002      Knowledge Questionnaire Score   Post Score  12/18       Core Components/Risk Factors/Patient Goals at Admission: Personal Goals and Risk Factors at Admission - 01/07/18 1138      Core Components/Risk Factors/Patient Goals on Admission    Weight Management  Yes;Weight Maintenance    Intervention  Weight Management: Develop a combined nutrition and exercise program designed to reach desired caloric intake, while maintaining appropriate intake of nutrient and fiber, sodium and fats, and appropriate energy expenditure required for the weight goal.;Weight Management: Provide education and appropriate resources to help participant work on and attain dietary goals.;Weight Management/Obesity: Establish reasonable short term and long term weight goals.    Admit Weight  195 lb 12.3 oz (88.8 kg)    Expected Outcomes  Weight Maintenance: Understanding of the daily nutrition guidelines, which includes 25-35% calories from fat, 7% or less cal from saturated fats, less than 279m cholesterol, less than 1.5gm of sodium, & 5 or more servings of fruits and vegetables daily;Understanding recommendations for meals to include 15-35% energy as protein, 25-35% energy from fat, 35-60% energy from carbohydrates, less than 2035mof dietary cholesterol, 20-35 gm of total fiber daily;Understanding of distribution of calorie intake throughout the day with the consumption of 4-5 meals/snacks;Short Term: Continue to assess and modify interventions until short term weight is achieved;Long Term: Adherence to nutrition and physical activity/exercise program aimed toward attainment of established weight goal    Improve shortness of breath with ADL's  Yes    Intervention  Provide education,  individualized exercise plan and daily activity instruction to help decrease symptoms of SOB with activities of daily living.    Expected Outcomes  Short Term: Improve cardiorespiratory fitness to achieve a reduction of symptoms when performing ADLs;Long Term: Be able to perform more ADLs without symptoms or delay the onset of symptoms       Core Components/Risk Factors/Patient Goals Review:  Goals and Risk Factor Review    Row Name 01/28/18 1722 02/25/18 1022 03/26/18 0919 04/23/18 0947 04/23/18 0958     Core Components/Risk Factors/Patient Goals Review   Personal Goals Review  Weight Management/Obesity;Develop more efficient breathing techniques such as purse lipped breathing and diaphragmatic breathing and practicing self-pacing with activity.;Improve shortness of breath with ADL's;Increase knowledge of respiratory medications and ability to use respiratory devices properly.  Weight Management/Obesity;Develop more efficient breathing techniques such as purse lipped breathing and diaphragmatic breathing and practicing self-pacing with activity.;Improve shortness of breath with ADL's;Increase knowledge of respiratory medications and ability to use respiratory devices properly.;Stress  Weight Management/Obesity;Develop more efficient breathing techniques such as purse lipped breathing and diaphragmatic breathing and practicing self-pacing with activity.;Improve shortness of breath with ADL's;Increase knowledge of respiratory medications and ability to use respiratory devices properly.;Stress  Weight Management/Obesity;Develop more efficient breathing techniques such as purse lipped breathing and diaphragmatic breathing and practicing self-pacing with activity.;Improve shortness of breath with ADL's;Increase knowledge of respiratory medications and ability to use respiratory devices properly.;Stress  -   Review  Pt has completed 4 exercise sessions and 2 education classes. Pt is doing well in rehab and  this has been a tough transition for him due to his very young age in comparison to others.  Encourage pt to remain focused on his personal goals.  Pt weight  remains unchanged from 10/3.  Pt has attended edcuation classes on oxygen and durable home equipment. Pt demonstrates good technique with PLB. Will hopefully move toward independence. workloads airydyne .8. rower level 2 and treadmill 2.2/2.0.  Continue to monitor pt progress during next 30 day review.  Pt has completed 12 exercise sessions and 5 education classes. Pt is doing well in rehab and this has been a tough transition for him due to his very young age in comparison to others.  Encourage pt to remain focused on his personal goals.  Pt weight rdecreased 3.5 kg.  Pt demonstrates good technique with PLB. Will hopefully move toward independence. workloads airydyne 1.3. rower increase to  level 4 and treadmill 2.5/3.0.  Continue to monitor pt progress during next 30 day review.  Pt has completed 17  exercise sessions and 8 education classes. Pt is doing well in rehab and this has been a tough transition for him due to his very young age in comparison to others.  Encourage pt to remain focused on his personal goals.  Pt weight decreased 3.9 kg.  Pt demonstrates good technique with PLB. and he will pace himself with breaks as needed.  Workloads airydyne 1.3. rower increase to  level 6 and treadmill 3.0/5.0.  Continue to monitor pt progress during next 30 day review.  Pt has completed 19 exercise sessions and 10 education classes. He is set to graduate today. This continues to be a tough transition for him due to his very young age in comparison to others. Pt has also had financal set back that prohibitted him from participating in rehab for approximately a month. Pt weight decreased 4 kg.  Pt demonstrates good technique with PLB. and he will pace himself with breaks as needed.  Workloads airydyne 1.3. rower maintained at level 6 and treadmill level has  increased to 2.5/7.0.  Pt has become open to transplant option and is set to attend infomration session for consideration.  -   Expected Outcomes  See admission goals/outcomes  See admission goals/outcomes  See admission goals/outcomes  pt was successful in losing 4 kg at his time in rehab. He was effective in taking breaks to maintain dyspnea and has had   pt was successful in losing 4 kg at his time in rehab. He was effective in taking breaks to maintain dyspnea and has attended multiple classes to increase his education.    [place] Name 04/23/18 1138             Core Components/Risk Factors/Patient Goals Review   Personal Goals Review  Weight Management/Obesity;Develop more efficient breathing techniques such as purse lipped breathing and diaphragmatic breathing and practicing self-pacing with activity.;Improve shortness of breath with ADL's;Increase knowledge of respiratory medications and ability to use respiratory devices properly.;Stress       Review  Pt has completed 19 exercise sessions and 10 education classes. He is set to graduate today. This continues to be a tough transition for him due to his very young age in comparison to others. Pt has also had financal set back that prohibitted him from participating in rehab for approximately a month. Pt weight decreased 4 kg.  Pt demonstrates good technique with PLB. and he will pace himself with breaks as needed.  Workloads airydyne 1.3. rower maintained at level 6 and treadmill level has increased to 2.5/7.0.  Pt has become open to transplant option and is set to attend infomration session for consideration. Pt plans to get a [employer] membership to  continue exercise at the same time as he has been coming to rehab so that if it does not interfere with his work schedule.        Expected Outcomes  Pt is graduating from rehab today. He is in good spirits and has plans to continue exercise at a local gym that is right down from his house.          Core  Components/Risk Factors/Patient Goals at Discharge (Final Review):  Goals and Risk Factor Review - 04/23/18 1138      Core Components/Risk Factors/Patient Goals Review   Personal Goals Review  Weight Management/Obesity;Develop more efficient breathing techniques such as purse lipped breathing and diaphragmatic breathing and practicing self-pacing with activity.;Improve shortness of breath with ADL's;Increase knowledge of respiratory medications and ability to use respiratory devices properly.;Stress    Review  Pt has completed 19 exercise sessions and 10 education classes. He is set to graduate today. This continues to be a tough transition for him due to his very young age in comparison to others. Pt has also had financal set back that prohibitted him from participating in rehab for approximately a month. Pt weight decreased 4 kg.  Pt demonstrates good technique with PLB. and he will pace himself with breaks as needed.  Workloads airydyne 1.3. rower maintained at level 6 and treadmill level has increased to 2.5/7.0.  Pt has become open to transplant option and is set to attend infomration session for consideration. Pt plans to get a [employer] membership to continue exercise at the same time as he has been coming to rehab so that if it does not interfere with his work schedule.     Expected Outcomes  Pt is graduating from rehab today. He is in good spirits and has plans to continue exercise at a local gym that is right down from his house.       ITP Comments: ITP Comments    Row Name 01/07/18 1004 01/28/18 1718 02/25/18 1019 03/26/18 0915 04/23/18 3419   ITP Comments  Dr. Manfred Arch, Medical Director Pulmonary Rehab  Dr. Manfred Arch, Medical Director Pulmonary Rehab  Dr. Manfred Arch, Medical Director Pulmonary Rehab  Dr. Manfred Arch, Medical Director Pulmonary Rehab  Dr. Manfred Arch, Medical Director Pulmonary Rehab      Comments: ITP REVIEW Pt is making expected progress toward personal  goals after completing  19 sessions.   Recommend continued exercise, life style modification, education, and utilization of breathing texhniques to increase stamina and strength and decrease shortness of breath with exertion.

## 2018-04-28 ENCOUNTER — Encounter (HOSPITAL_COMMUNITY): Payer: 59

## 2018-04-30 ENCOUNTER — Encounter (HOSPITAL_COMMUNITY): Payer: 59

## 2018-05-05 ENCOUNTER — Encounter (HOSPITAL_COMMUNITY): Payer: 59

## 2018-05-07 ENCOUNTER — Encounter (HOSPITAL_COMMUNITY): Payer: 59

## 2018-05-12 ENCOUNTER — Encounter (HOSPITAL_COMMUNITY): Payer: 59

## 2018-05-14 ENCOUNTER — Encounter (HOSPITAL_COMMUNITY): Payer: 59

## 2018-05-19 ENCOUNTER — Encounter (HOSPITAL_COMMUNITY): Payer: 59

## 2018-05-21 ENCOUNTER — Encounter (HOSPITAL_COMMUNITY): Payer: 59

## 2018-05-22 NOTE — Addendum Note (Signed)
Encounter addended by: Enid Skeens, RD on: 05/22/2018 9:03 AM  Actions taken: Visit Navigator Flowsheet section accepted

## 2018-05-26 ENCOUNTER — Encounter (HOSPITAL_COMMUNITY): Payer: 59

## 2018-05-28 ENCOUNTER — Encounter (HOSPITAL_COMMUNITY): Payer: 59

## 2018-06-02 ENCOUNTER — Encounter (HOSPITAL_COMMUNITY): Payer: 59

## 2018-06-04 ENCOUNTER — Encounter (HOSPITAL_COMMUNITY): Payer: 59

## 2018-06-30 NOTE — Progress Notes (Signed)
Discharge Progress Report  Patient Details  Name: Timothy Andrews MRN: [id number] Date of Birth: [birth year] Referring Provider:     Pulmonary Rehab Walk Test from 01/08/2018 in [facility]  Referring Provider  Dr. Nelda Andrews       Number of Visits: 20  Reason for Discharge:  Patient independent in their exercise. Patient has met program and personal goals.  Smoking History:  Social History   Tobacco Use  Smoking Status Former Smoker  . Packs/day: 0.25  . Years: 3.00  . Pack years: 0.75  . Types: Cigars  . Last attempt to quit: 07/13/2017  . Years since quitting: 0.9  Smokeless Tobacco Never Used    Diagnosis:  ILD (interstitial lung disease) ([place])  ADL [facility]: Pulmonary Assessment Scores    Row Name 01/09/18 1005 04/22/18 1002 06/30/18 1338     ADL [facility]   ADL Phase  Entry  Exit  -   SOB Score total  -  60  - pt shows a mild improvement in scores from a 68 to 60     CAT Score   CAT Score  -  28  -     mMRC Score   mMRC Score  2  -  -      Initial Exercise Prescription: Initial Exercise Prescription - 01/09/18 1000      Date of Initial Exercise RX and Referring Provider   Date  01/08/18    Referring Provider  Dr. Nelda Andrews      Treadmill   MPH  2.5    Grade  3    Minutes  17      Bike   Level  0.8    Minutes  17      Rower   Level  2    Watts  40    Minutes  17      Prescription Details   Frequency (times per week)  2    Duration  Progress to 45 minutes of aerobic exercise without signs/symptoms of physical distress      Intensity   THRR 40-80% of Max Heartrate  79-158    Ratings of Perceived Exertion  11-13    Perceived Dyspnea  0-4      Progression   Progression  Continue progressive overload as per policy without signs/symptoms or physical distress.      Resistance Training   Training Prescription  Yes    Weight  blue bands    Reps  10-15       Discharge Exercise Prescription (Final Exercise Prescription  Changes): Exercise Prescription Changes - 04/21/18 1200      Response to Exercise   Blood Pressure (Admit)  108/67    Blood Pressure (Exercise)  140/80    Blood Pressure (Exit)  122/76    Heart Rate (Admit)  88 bpm    Heart Rate (Exercise)  124 bpm    Heart Rate (Exit)  90 bpm    Oxygen Saturation (Admit)  95 %    Oxygen Saturation (Exercise)  89 %    Oxygen Saturation (Exit)  97 %    Rating of Perceived Exertion (Exercise)  12    Perceived Dyspnea (Exercise)  2    Duration  Progress to 45 minutes of aerobic exercise without signs/symptoms of physical distress    Intensity  THRR unchanged      Resistance Training   Training Prescription  Yes    Weight  blue bands  Reps  10-15    Time  10 Minutes      Interval Training   Interval Training  No      Treadmill   MPH  2.5    Grade  7    Minutes  17      Bike   Level  1.3    Minutes  17      Rower   Level  6    Minutes  17       Functional Capacity: 6 Minute Walk    Row Name 01/09/18 1004 04/23/18 1210 04/23/18 1249     6 Minute Walk   Phase  Initial  Discharge  Discharge   Distance  1275 feet  -  1496 feet   Distance Feet Change  -  -  221 ft   Walk Time  6 minutes  -  6 minutes   # of Rest Breaks  0  -  0   MPH  2.41  -  2.83   METS  2.84  -  3.14   RPE  12  -  11   Perceived Dyspnea   1  -  1   Symptoms  No  -  No   Resting HR  73 bpm  -  73 bpm   Resting BP  134/84  -  110/62   Resting Oxygen Saturation   96 %  -  97 %   Exercise Oxygen Saturation  during 6 min walk  88 %  -  85 %   Max Ex. HR  77 bpm  -  111 bpm   Max Ex. BP  140/80  -  140/74   2 Minute Post BP  -  -  112/72     Interval HR   1 Minute HR  76  -  98   2 Minute HR  77  -  101   3 Minute HR  77  -  102   4 Minute HR  75  -  102   5 Minute HR  75  -  109   6 Minute HR  75  -  111   2 Minute Post HR  72  -  68   Interval Heart Rate?  Yes  -  Yes     Interval Oxygen   Interval Oxygen?  Yes  -  Yes   Baseline Oxygen Saturation %   96 %  -  97 %   1 Minute Oxygen Saturation %  94 %  -  96 %   1 Minute Liters of Oxygen  0 L  -  0 L   2 Minute Oxygen Saturation %  92 %  -  93 %   2 Minute Liters of Oxygen  0 L  -  0 L   3 Minute Oxygen Saturation %  89 %  -  92 %   3 Minute Liters of Oxygen  0 L  -  0 L   4 Minute Oxygen Saturation %  88 %  -  89 %   4 Minute Liters of Oxygen  0 L  -  0 L   5 Minute Oxygen Saturation %  90 %  -  86 %   5 Minute Liters of Oxygen  0 L  -  0 L   6 Minute Oxygen Saturation %  88 %  -  85 %   6 Minute Liters of Oxygen  0 L  -  0 L   2 Minute Post Oxygen Saturation %  96 %  -  98 %   2 Minute Post Liters of Oxygen  0 L  -  0 L      Psychological, QOL, Others - Outcomes: PHQ 2/9: Depression screen [name] 2/9 04/23/2018 01/07/2018 01/07/2018 01/17/2017 01/09/2017  Decreased Interest 0 - 0 0 0  Down, Depressed, Hopeless 0 - 2 0 0  PHQ - 2 Score 0 - 2 0 0  Altered sleeping 0 - 0 - -  Tired, decreased energy 0 - 0 - -  Change in appetite 0 - 0 - -  Feeling bad or failure about yourself  0 - 3 - -  Trouble concentrating 0 - 0 - -  Moving slowly or fidgety/restless 0 - 0 - -  Suicidal thoughts 0 (No Data) 1 - -  PHQ-9 Score 0 - 6 - -  Difficult doing work/chores - - Very difficult - -    Quality of Life:   Personal Goals: Goals established at orientation with interventions provided to work toward goal. Personal Goals and Risk Factors at Admission - 01/07/18 1138      Core Components/Risk Factors/Patient Goals on Admission    Weight Management  Yes;Weight Maintenance    Intervention  Weight Management: Develop a combined nutrition and exercise program designed to reach desired caloric intake, while maintaining appropriate intake of nutrient and fiber, sodium and fats, and appropriate energy expenditure required for the weight goal.;Weight Management: Provide education and appropriate resources to help participant work on and attain dietary goals.;Weight Management/Obesity: Establish  reasonable short term and long term weight goals.    Admit Weight  195 lb 12.3 oz (88.8 kg)    Expected Outcomes  Weight Maintenance: Understanding of the daily nutrition guidelines, which includes 25-35% calories from fat, 7% or less cal from saturated fats, less than 225m cholesterol, less than 1.5gm of sodium, & 5 or more servings of fruits and vegetables daily;Understanding recommendations for meals to include 15-35% energy as protein, 25-35% energy from fat, 35-60% energy from carbohydrates, less than 2028mof dietary cholesterol, 20-35 gm of total fiber daily;Understanding of distribution of calorie intake throughout the day with the consumption of 4-5 meals/snacks;Short Term: Continue to assess and modify interventions until short term weight is achieved;Long Term: Adherence to nutrition and physical activity/exercise program aimed toward attainment of established weight goal    Improve shortness of breath with ADL's  Yes    Intervention  Provide education, individualized exercise plan and daily activity instruction to help decrease symptoms of SOB with activities of daily living.    Expected Outcomes  Short Term: Improve cardiorespiratory fitness to achieve a reduction of symptoms when performing ADLs;Long Term: Be able to perform more ADLs without symptoms or delay the onset of symptoms        Personal Goals Discharge: Goals and Risk Factor Review    Row Name 01/28/18 1722 02/25/18 1022 03/26/18 0919 04/23/18 0947 04/23/18 0958     Core Components/Risk Factors/Patient Goals Review   Personal Goals Review  Weight Management/Obesity;Develop more efficient breathing techniques such as purse lipped breathing and diaphragmatic breathing and practicing self-pacing with activity.;Improve shortness of breath with ADL's;Increase knowledge of respiratory medications and ability to use respiratory devices properly.  Weight Management/Obesity;Develop more efficient breathing techniques such as purse  lipped breathing and diaphragmatic breathing and practicing self-pacing with activity.;Improve shortness of breath with ADL's;Increase knowledge of respiratory medications and ability to  use respiratory devices properly.;Stress  Weight Management/Obesity;Develop more efficient breathing techniques such as purse lipped breathing and diaphragmatic breathing and practicing self-pacing with activity.;Improve shortness of breath with ADL's;Increase knowledge of respiratory medications and ability to use respiratory devices properly.;Stress  Weight Management/Obesity;Develop more efficient breathing techniques such as purse lipped breathing and diaphragmatic breathing and practicing self-pacing with activity.;Improve shortness of breath with ADL's;Increase knowledge of respiratory medications and ability to use respiratory devices properly.;Stress  -   Review  Pt has completed 4 exercise sessions and 2 education classes. Pt is doing well in rehab and this has been a tough transition for him due to his very young age in comparison to others.  Encourage pt to remain focused on his personal goals.  Pt weight remains unchanged from 10/3.  Pt has attended edcuation classes on oxygen and durable home equipment. Pt demonstrates good technique with PLB. Will hopefully move toward independence. workloads airydyne .8. rower level 2 and treadmill 2.2/2.0.  Continue to monitor pt progress during next 30 day review.  Pt has completed 12 exercise sessions and 5 education classes. Pt is doing well in rehab and this has been a tough transition for him due to his very young age in comparison to others.  Encourage pt to remain focused on his personal goals.  Pt weight rdecreased 3.5 kg.  Pt demonstrates good technique with PLB. Will hopefully move toward independence. workloads airydyne 1.3. rower increase to  level 4 and treadmill 2.5/3.0.  Continue to monitor pt progress during next 30 day review.  Pt has completed 17  exercise sessions  and 8 education classes. Pt is doing well in rehab and this has been a tough transition for him due to his very young age in comparison to others.  Encourage pt to remain focused on his personal goals.  Pt weight decreased 3.9 kg.  Pt demonstrates good technique with PLB. and he will pace himself with breaks as needed.  Workloads airydyne 1.3. rower increase to  level 6 and treadmill 3.0/5.0.  Continue to monitor pt progress during next 30 day review.  Pt has completed 19 exercise sessions and 10 education classes. He is set to graduate today. This continues to be a tough transition for him due to his very young age in comparison to others. Pt has also had financal set back that prohibitted him from participating in rehab for approximately a month. Pt weight decreased 4 kg.  Pt demonstrates good technique with PLB. and he will pace himself with breaks as needed.  Workloads airydyne 1.3. rower maintained at level 6 and treadmill level has increased to 2.5/7.0.  Pt has become open to transplant option and is set to attend infomration session for consideration.  -   Expected Outcomes  See admission goals/outcomes  See admission goals/outcomes  See admission goals/outcomes  pt was successful in losing 4 kg at his time in rehab. He was effective in taking breaks to maintain dyspnea and has had   pt was successful in losing 4 kg at his time in rehab. He was effective in taking breaks to maintain dyspnea and has attended multiple classes to increase his education.    [place] Name 04/23/18 1138 06/30/18 1343           Core Components/Risk Factors/Patient Goals Review   Personal Goals Review  Weight Management/Obesity;Develop more efficient breathing techniques such as purse lipped breathing and diaphragmatic breathing and practicing self-pacing with activity.;Improve shortness of breath with ADL's;Increase knowledge of respiratory medications and  ability to use respiratory devices properly.;Stress  Weight  Management/Obesity;Develop more efficient breathing techniques such as purse lipped breathing and diaphragmatic breathing and practicing self-pacing with activity.;Improve shortness of breath with ADL's;Increase knowledge of respiratory medications and ability to use respiratory devices properly.;Stress      Review  Pt has completed 19 exercise sessions and 10 education classes. He is set to graduate today. This continues to be a tough transition for him due to his very young age in comparison to others. Pt has also had financal set back that prohibitted him from participating in rehab for approximately a month. Pt weight decreased 4 kg.  Pt demonstrates good technique with PLB. and he will pace himself with breaks as needed.  Workloads airydyne 1.3. rower maintained at level 6 and treadmill level has increased to 2.5/7.0.  Pt has become open to transplant option and is set to attend infomration session for consideration. Pt plans to get a [employer] membership to continue exercise at the same time as he has been coming to rehab so that if it does not interfere with his work schedule.   Pt graduating from rehab today with the completion of 20 sessions.       Expected Outcomes  Pt is graduating from rehab today. He is in good spirits and has plans to continue exercise at a local gym that is right down from his house.  Pt strugged in the program psychosocially. Pt has stress concerns because he was so much younger than most program participants. Pt also had finanacial concerns and set backs that prohibitted from attwnding rehab occasionally. Pt reports that things have resolves and are better. He saw a weight loss of 3.8 kg during the program as well         Exercise Goals and Review: Exercise Goals    Row Name 01/07/18 1018             Exercise Goals   Increase Physical Activity  Yes       Intervention  Provide advice, education, support and counseling about physical activity/exercise needs.;Develop an  individualized exercise prescription for aerobic and resistive training based on initial evaluation findings, risk stratification, comorbidities and participant's personal goals.       Expected Outcomes  Short Term: Attend rehab on a regular basis to increase amount of physical activity.;Long Term: Exercising regularly at least 3-5 days a week.;Long Term: Add in home exercise to make exercise part of routine and to increase amount of physical activity.       Increase Strength and Stamina  Yes       Intervention  Provide advice, education, support and counseling about physical activity/exercise needs.;Develop an individualized exercise prescription for aerobic and resistive training based on initial evaluation findings, risk stratification, comorbidities and participant's personal goals.       Expected Outcomes  Short Term: Increase workloads from initial exercise prescription for resistance, speed, and METs.;Short Term: Perform resistance training exercises routinely during rehab and add in resistance training at home;Long Term: Improve cardiorespiratory fitness, muscular endurance and strength as measured by increased METs and functional capacity (6MWT)       Able to understand and use rate of perceived exertion (RPE) scale  Yes       Intervention  Provide education and explanation on how to use RPE scale       Expected Outcomes  Short Term: Able to use RPE daily in rehab to express subjective intensity level;Long Term:  Able to use RPE  to guide intensity level when exercising independently       Able to understand and use Dyspnea scale  Yes       Intervention  Provide education and explanation on how to use Dyspnea scale       Expected Outcomes  Short Term: Able to use Dyspnea scale daily in rehab to express subjective sense of shortness of breath during exertion;Long Term: Able to use Dyspnea scale to guide intensity level when exercising independently       Knowledge and understanding of Target Heart  Rate Range (THRR)  Yes       Intervention  Provide education and explanation of THRR including how the numbers were predicted and where they are located for reference       Expected Outcomes  Short Term: Able to state/look up THRR;Long Term: Able to use THRR to govern intensity when exercising independently;Short Term: Able to use daily as guideline for intensity in rehab       Understanding of Exercise Prescription  Yes       Intervention  Provide education, explanation, and written materials on patient's individual exercise prescription       Expected Outcomes  Short Term: Able to explain program exercise prescription;Long Term: Able to explain home exercise prescription to exercise independently          Exercise Goals Re-Evaluation: Exercise Goals Re-Evaluation    Row Name 01/27/18 1018 02/25/18 0814 03/24/18 1632 04/21/18 0757       Exercise Goal Re-Evaluation   Exercise Goals Review  Increase Physical Activity;Increase Strength and Stamina;Able to understand and use rate of perceived exertion (RPE) scale;Able to understand and use Dyspnea scale;Knowledge and understanding of Target Heart Rate Range (THRR);Understanding of Exercise Prescription  Increase Physical Activity;Increase Strength and Stamina;Able to understand and use rate of perceived exertion (RPE) scale;Able to understand and use Dyspnea scale;Knowledge and understanding of Target Heart Rate Range (THRR);Understanding of Exercise Prescription  Increase Physical Activity;Increase Strength and Stamina;Able to understand and use rate of perceived exertion (RPE) scale;Able to understand and use Dyspnea scale;Knowledge and understanding of Target Heart Rate Range (THRR);Understanding of Exercise Prescription  Increase Physical Activity;Increase Strength and Stamina;Able to understand and use rate of perceived exertion (RPE) scale;Able to understand and use Dyspnea scale;Knowledge and understanding of Target Heart Rate Range  (THRR);Understanding of Exercise Prescription    Comments  Patient has only attended 3 rehab sessions. Patient is very gaurded and hesitant to workload changes. Takes numerous breaks during exercise. Will cont. to monitor and motivate as able.  Pt is gaurded and takes frequent breaks during exercise. Have went over breathing techniques with pt several times. Will continue to monitor and progress as able.   Pt is taking less frequent rest breaks, but still takes several. Pt is gaurded, so talking to him can be difficult at times. He is accepting to increased workloads and increases at a moderate MET level of 5.36 while on the treadmill. Will continue to monitor and progress as able.   Pt missed a few weeks of exercise due to financial difficulties. Pt has returned for 1 visit and will graduate on 1/9. Pt exercises at 5.36 METs on the treadmill but continue to take breaks but they are less frequent. Pt states that he will continue to exercise at a local gym. Will continue to monitor and progress as able.     Expected Outcomes  Through exercise at rehab and at home, patient will increase physicial capacity and ADL's will be  easier to perform at home. Patient will also develop exercise regime at home.  Through exercise at rehab and at home, patient will increase physicial capacity and ADL's will be easier to perform at home. Patient will also develop exercise regime at home.  Through exercise at rehab and at home, patient will increase physicial capacity and ADL's will be easier to perform at home. Patient will also develop exercise regime at home.  Through exercise at rehab and at home, patient will increase physicial capacity and ADL's will be easier to perform at home. Patient will also develop exercise regime at home.       Nutrition & Weight - Outcomes:    Nutrition: Nutrition Therapy & Goals - 05/22/18 0901      Nutrition Therapy   Diet  general healthful       Personal Nutrition Goals   Nutrition  Goal  Pt to describe the benefit of including whole grains, lean protein, low fat dairy, fruits and vegetables in his diet      Intervention Plan   Intervention  Prescribe, educate and counsel regarding individualized specific dietary modifications aiming towards targeted core components such as weight, hypertension, lipid management, diabetes, heart failure and other comorbidities.    Expected Outcomes  Short Term Goal: Understand basic principles of dietary content, such as calories, fat, sodium, cholesterol and nutrients.;Long Term Goal: Adherence to prescribed nutrition plan.       Nutrition Discharge: Nutrition Assessments - 05/22/18 0903      Rate Your Plate Scores   Pre Score  65    Post Score  34       Education Questionnaire Score: Knowledge Questionnaire Score - 06/30/18 1340      Knowledge Questionnaire Score   Pre Score  15/18       Goals reviewed with patient.  Joycelyn Man RN, BSN Cardiac and Pulmonary Rehab RN

## 2018-06-30 NOTE — Addendum Note (Signed)
Encounter addended by: Lyda Kalata, RN on: 06/30/2018 1:48 PM  Actions taken: Episode resolved, Flowsheet data copied forward, Visit Navigator Flowsheet section accepted, Clinical Note Signed

## 2018-10-07 IMAGING — CT CT OUTSIDE FILMS CHEST
5 of 8 series · 18 of 36 positions shown, 19 images · IV contrast (OMNI 350)
Comparison: none

[Series 7: #pp de_thorax 2.0 q30f 2 a_(id) · axial · 0.78mm/px · z∈[-275,-114]mm · 3 of 231 slices shown, 4 images]
[im 58/231  mediastinal]
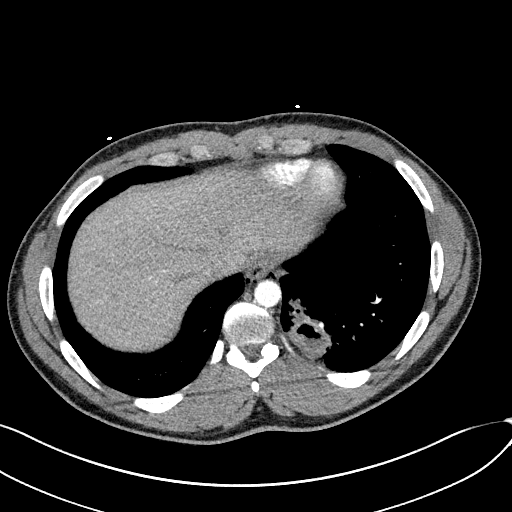
[im 58/231  lung]
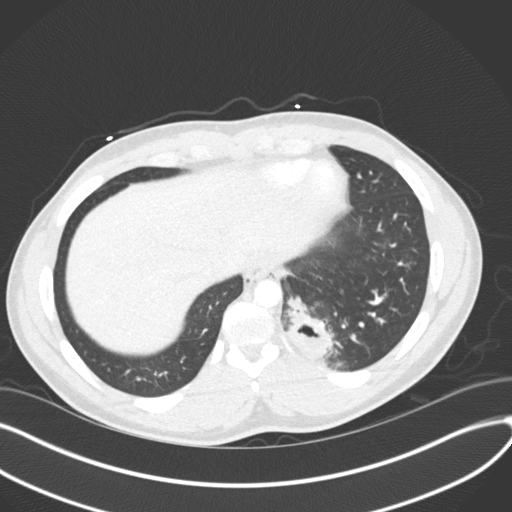
[im 116/231  lung]
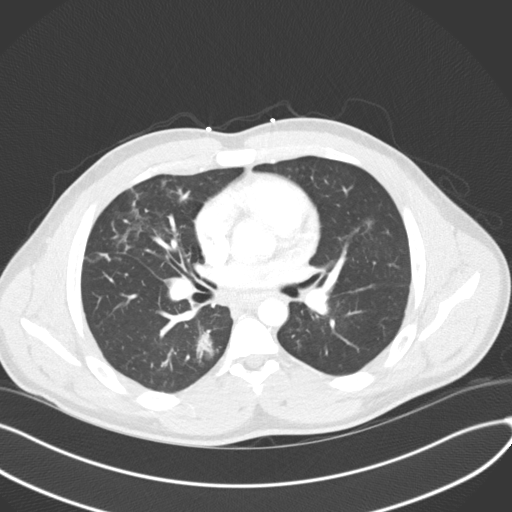
[im 173/231  lung]
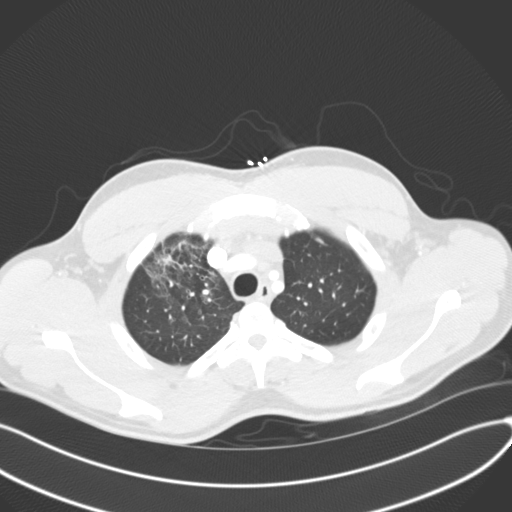

[Series 8: #pp de_thorax 2.0 q30f 2 b_(id) · axial · 0.78mm/px · z∈[-275,-114]mm · 3 of 231 slices shown]
[im 58/231  lung]
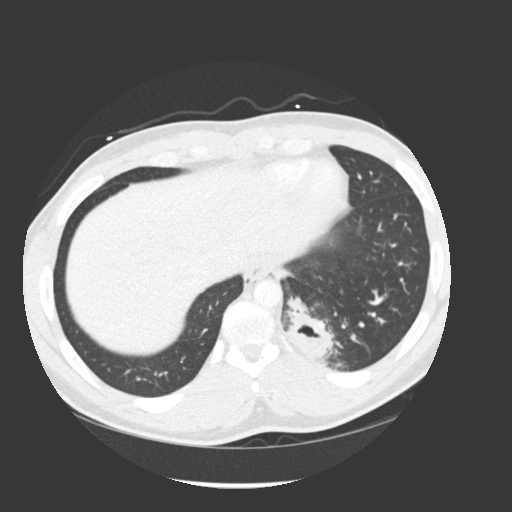
[im 116/231  lung]
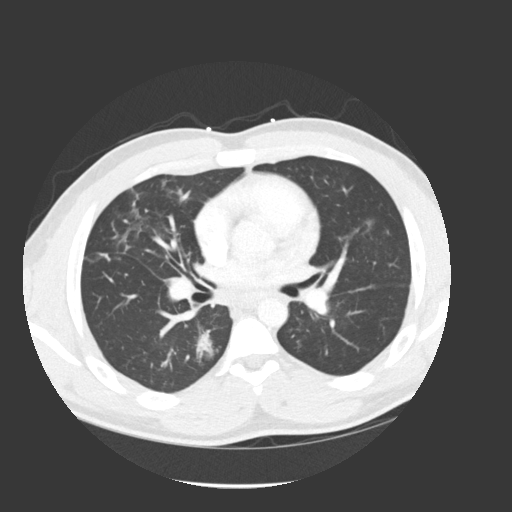
[im 173/231  lung]
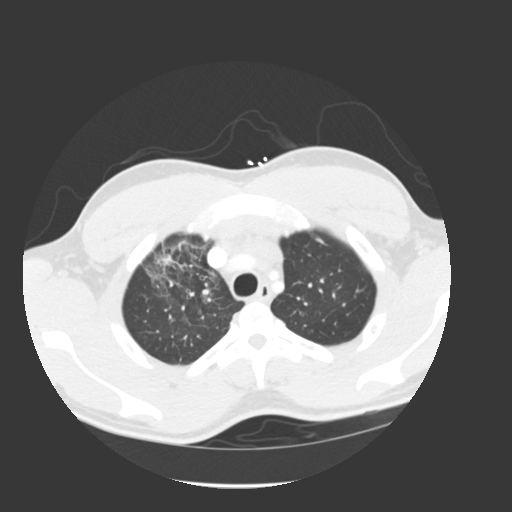

[Series 10: cor chest pe std mpr +c f_0.5 · coronal · 0.70mm/px · 3 of 187 slices shown]
[im 47/187  lung]
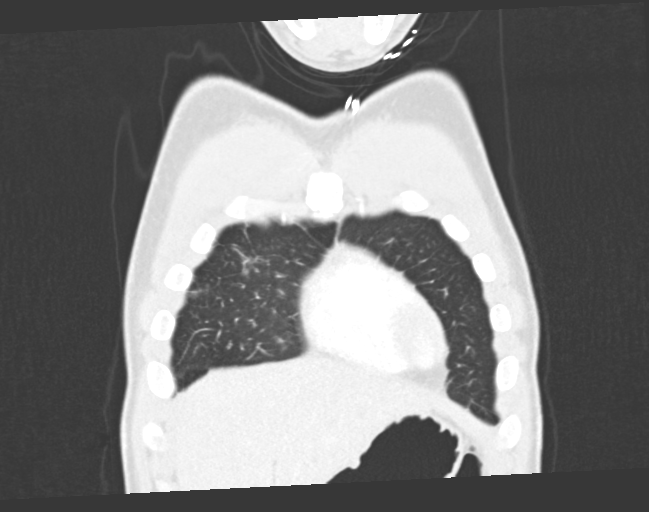
[im 94/187  lung]
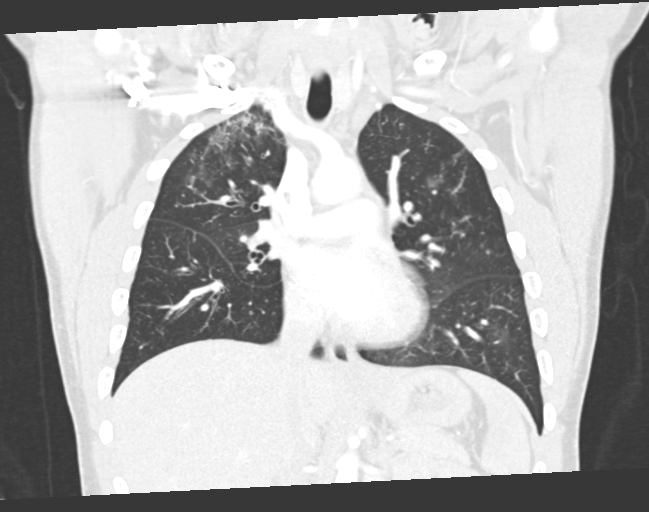
[im 140/187  lung]
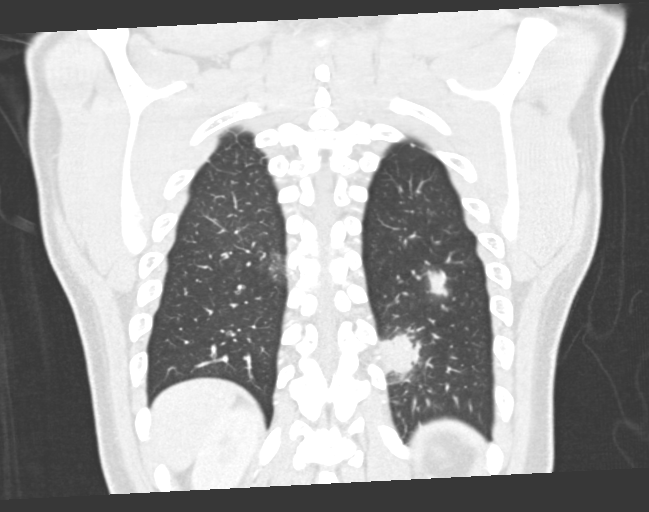

[Series 12: #pp de_thorax 2.0 q30f 2 optimum contrast#0 (auto) · axial · 0.78mm/px · z∈[-275,-114]mm · 3 of 231 slices shown]
[im 58/231  lung]
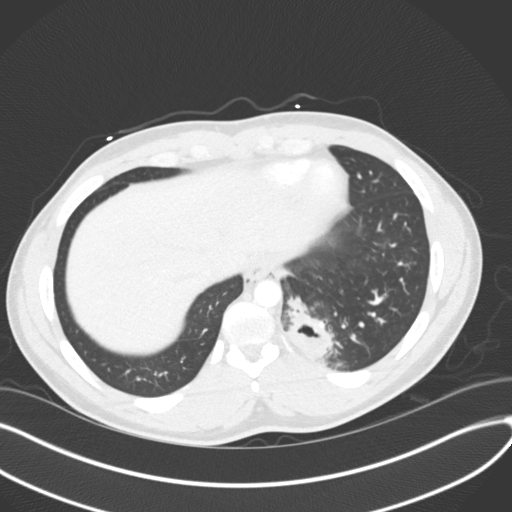
[im 116/231  lung]
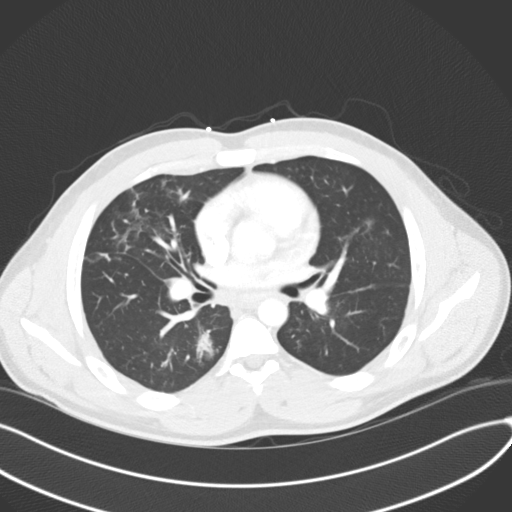
[im 173/231  lung]
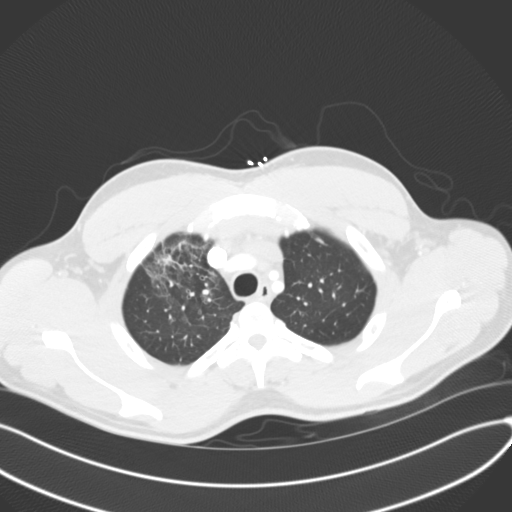

[Series 12: ax chest pe std thin +c f_0.6 · axial · 0.78mm/px · z∈[-316,-114]mm · 6 of 462 slices shown]
[im 58/462  mediastinal]
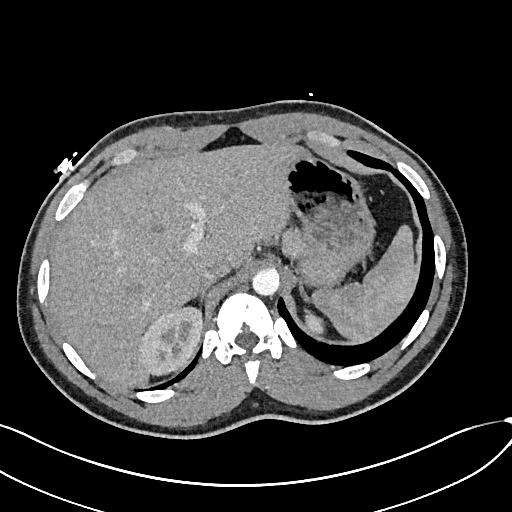
[im 116/462  mediastinal]
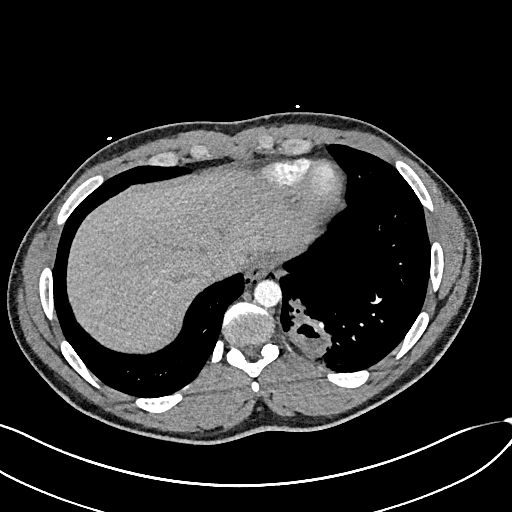
[im 173/462  mediastinal]
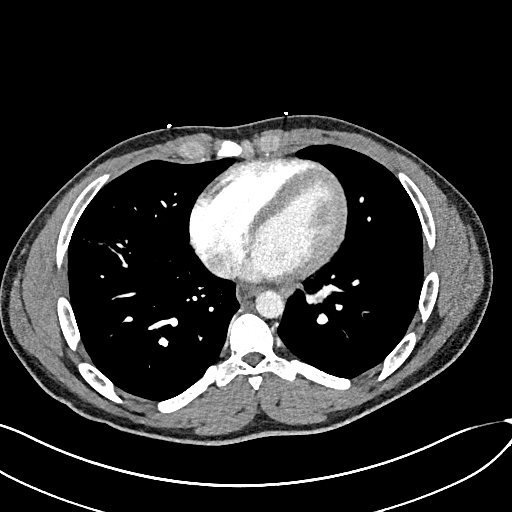
[im 231/462  mediastinal]
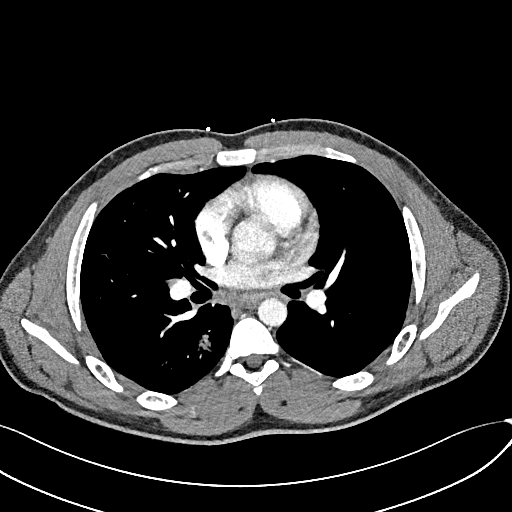
[im 289/462  mediastinal]
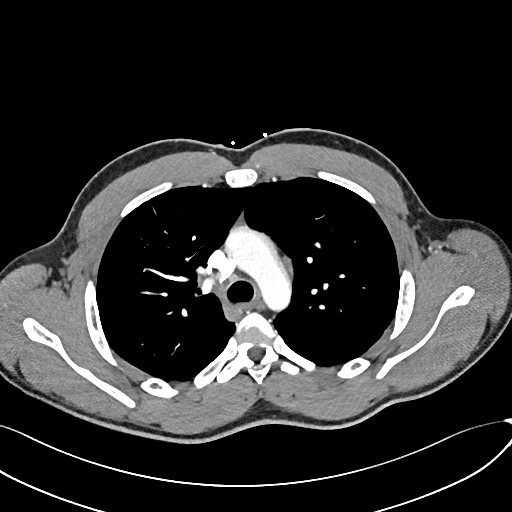
[im 346/462  mediastinal]
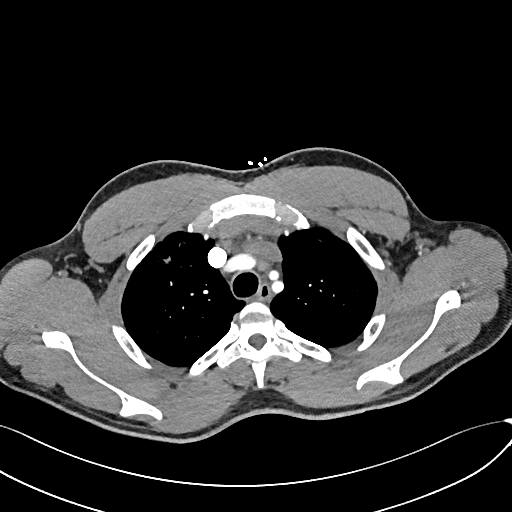

[18 of 36 positions shown; findings below may reference images not displayed]

Canned report from images found in remote index.

Refer to host system for actual result text.

## 2019-12-02 DIAGNOSIS — J4481 Bronchiolitis obliterans and bronchiolitis obliterans syndrome: Secondary | ICD-10-CM | POA: Insufficient documentation

## 2022-08-13 DIAGNOSIS — Z01818 Encounter for other preprocedural examination: Secondary | ICD-10-CM | POA: Insufficient documentation

## 2022-08-13 DIAGNOSIS — J449 Chronic obstructive pulmonary disease, unspecified: Secondary | ICD-10-CM | POA: Insufficient documentation

## 2022-08-13 DIAGNOSIS — J9612 Chronic respiratory failure with hypercapnia: Secondary | ICD-10-CM | POA: Insufficient documentation

## 2022-09-18 DIAGNOSIS — D638 Anemia in other chronic diseases classified elsewhere: Secondary | ICD-10-CM | POA: Insufficient documentation

## 2022-10-07 DIAGNOSIS — T380X5A Adverse effect of glucocorticoids and synthetic analogues, initial encounter: Secondary | ICD-10-CM | POA: Insufficient documentation

## 2022-10-07 DIAGNOSIS — R739 Hyperglycemia, unspecified: Secondary | ICD-10-CM | POA: Insufficient documentation

## 2022-10-07 DIAGNOSIS — Z978 Presence of other specified devices: Secondary | ICD-10-CM | POA: Insufficient documentation

## 2022-10-07 DIAGNOSIS — Z942 Lung transplant status: Secondary | ICD-10-CM | POA: Insufficient documentation

## 2022-10-07 DIAGNOSIS — D849 Immunodeficiency, unspecified: Secondary | ICD-10-CM | POA: Insufficient documentation

## 2022-10-08 DIAGNOSIS — G479 Sleep disorder, unspecified: Secondary | ICD-10-CM | POA: Insufficient documentation

## 2022-10-08 DIAGNOSIS — G8918 Other acute postprocedural pain: Secondary | ICD-10-CM | POA: Insufficient documentation

## 2022-10-08 DIAGNOSIS — R0689 Other abnormalities of breathing: Secondary | ICD-10-CM | POA: Insufficient documentation

## 2022-10-09 DIAGNOSIS — R3589 Other polyuria: Secondary | ICD-10-CM | POA: Insufficient documentation

## 2022-10-09 DIAGNOSIS — R5381 Other malaise: Secondary | ICD-10-CM | POA: Insufficient documentation

## 2022-10-17 DIAGNOSIS — E875 Hyperkalemia: Secondary | ICD-10-CM | POA: Insufficient documentation

## 2022-10-18 DIAGNOSIS — R066 Hiccough: Secondary | ICD-10-CM | POA: Insufficient documentation

## 2022-10-30 DIAGNOSIS — D8989 Other specified disorders involving the immune mechanism, not elsewhere classified: Secondary | ICD-10-CM | POA: Insufficient documentation

## 2022-12-17 DIAGNOSIS — Z79899 Other long term (current) drug therapy: Secondary | ICD-10-CM | POA: Insufficient documentation

## 2022-12-17 DIAGNOSIS — T86819 Unspecified complication of lung transplant: Secondary | ICD-10-CM | POA: Insufficient documentation

## 2022-12-26 ENCOUNTER — Encounter: Payer: Self-pay | Admitting: Podiatry

## 2022-12-26 ENCOUNTER — Ambulatory Visit: Payer: Medicaid Other | Admitting: Podiatry

## 2022-12-26 DIAGNOSIS — L03032 Cellulitis of left toe: Secondary | ICD-10-CM

## 2022-12-26 DIAGNOSIS — L03031 Cellulitis of right toe: Secondary | ICD-10-CM

## 2022-12-26 MED ORDER — NEOMYCIN-POLYMYXIN-HC 1 % OT SOLN: OTIC | 1 refills | Status: AC

## 2022-12-26 NOTE — Patient Instructions (Signed)

## 2022-12-29 NOTE — Progress Notes (Signed)
Subjective:  Patient ID: Timothy Andrews., male    DOB: [birth year],  MRN: [id number] HPI Chief Complaint  Patient presents with   Toe Pain    Hallux bilateral - medial border, ingrown x 1 month, tried trimming and cleaning them with alcohol   New Patient (Initial Visit)    27 y.o. male presents with the above complaint.   ROS: Denies fever chills nausea vomit muscle aches pains calf pain back pain chest pain shortness of breath.  No past medical history on file. No past surgical history on file.  Current Outpatient Medications:    BD VEO INSULIN SYRINGE U/F 31G X 15/64" 0.3 ML MISC, , Disp: , Rfl:    fluconazole (DIFLUCAN) 200 MG tablet, Take by mouth., Disp: , Rfl:    gabapentin (NEURONTIN) 100 MG capsule, Take by mouth., Disp: , Rfl:    mycophenolate (CELLCEPT) 250 MG capsule, Take by mouth., Disp: , Rfl:    NEOMYCIN-POLYMYXIN-HYDROCORTISONE (CORTISPORIN) 1 % SOLN OTIC solution, Apply 1-2 drops to toe BID after soaking, Disp: 10 mL, Rfl: 1   omeprazole (PRILOSEC) 20 MG capsule, Take by mouth., Disp: , Rfl:    oxyCODONE (OXY IR/ROXICODONE) 5 MG immediate release tablet, Take 5 mg by mouth 4 (four) times daily as needed., Disp: , Rfl:    predniSONE (DELTASONE) 5 MG tablet, Take by mouth., Disp: , Rfl:    simethicone (MYLICON) 80 MG chewable tablet, Chew by mouth., Disp: , Rfl:    sodium chloride HYPERTONIC 3 % nebulizer solution, Inhale into the lungs., Disp: , Rfl:    Specialty Vitamins Products (MG PLUS PROTEIN) 133 MG TABS, Take by mouth., Disp: , Rfl:    sulfamethoxazole-trimethoprim (BACTRIM) 400-80 MG tablet, Take by mouth., Disp: , Rfl:    tacrolimus (PROGRAF) 1 MG capsule, Take by mouth., Disp: , Rfl:    valGANciclovir (VALCYTE) 450 MG tablet, Take by mouth., Disp: , Rfl:    Vitamin D, Ergocalciferol, (DRISDOL) 1.25 MG (50000 UNIT) CAPS capsule, Take by mouth., Disp: , Rfl:    albuterol (PROVENTIL) (2.5 MG/3ML) 0.083% nebulizer solution, Take 3 mLs (2.5 mg total) by  nebulization every 4 (four) hours as needed for wheezing or shortness of breath., Disp: 75 mL, Rfl: 12   Respiratory Therapy Supplies (FLUTTER) DEVI, Use as directed, Disp: 1 each, Rfl: 0  No Known Allergies Review of Systems Objective:  There were no vitals filed for this visit.  General: Well developed, nourished, in no acute distress, alert and oriented x3   Dermatological: Skin is warm, dry and supple bilateral. Nails x 10 are well maintained; remaining integument appears unremarkable at this time. There are no open sores, no preulcerative lesions, no rash or signs of infection present.  Fibular borders of the hallux left demonstrate erythema edema granulation tissue purulence and malodor.  Vascular: Dorsalis Pedis artery and Posterior Tibial artery pedal pulses are 2/4 bilateral with immedate capillary fill time. Pedal hair growth present. No varicosities and no lower extremity edema present bilateral.   Neruologic: Grossly intact via light touch bilateral. Vibratory intact via tuning fork bilateral. Protective threshold with Semmes Wienstein monofilament intact to all pedal sites bilateral. Patellar and Achilles deep tendon reflexes 2+ bilateral. No Babinski or clonus noted bilateral.   Musculoskeletal: No gross boney pedal deformities bilateral. No pain, crepitus, or limitation noted with foot and ankle range of motion bilateral. Muscular strength 5/5 in all groups tested bilateral.  Gait: Unassisted, Nonantalgic.    Radiographs:  None taken  Assessment & Plan:  Assessment: Ingrown toenail fibular border hallux bilateral paronychia.    Plan: Fibular nail avulsions were performed hallux bilaterally after local anesthetic all necrotic tissue was sharply resected as well as infectious tissue.  The area was copiously washed with alcohol.  And then dressed a compressive dressing was placed.  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     Dejha King T. [place], [place]
